# Patient Record
Sex: Female | Born: 1940 | Race: White | Hispanic: No | Marital: Married | Smoking: Never smoker
Health system: Southern US, Community
[De-identification: ages and names within clinical notes are randomized; demographics above are authoritative.]

## PROBLEM LIST (undated history)

## (undated) DIAGNOSIS — M48061 Spinal stenosis, lumbar region without neurogenic claudication: Secondary | ICD-10-CM

## (undated) DIAGNOSIS — I495 Sick sinus syndrome: Secondary | ICD-10-CM

## (undated) DIAGNOSIS — N183 Chronic kidney disease, stage 3 unspecified: Secondary | ICD-10-CM

## (undated) DIAGNOSIS — K449 Diaphragmatic hernia without obstruction or gangrene: Secondary | ICD-10-CM

## (undated) DIAGNOSIS — T783XXA Angioneurotic edema, initial encounter: Secondary | ICD-10-CM

## (undated) DIAGNOSIS — M48062 Spinal stenosis, lumbar region with neurogenic claudication: Secondary | ICD-10-CM

## (undated) DIAGNOSIS — E079 Disorder of thyroid, unspecified: Secondary | ICD-10-CM

## (undated) DIAGNOSIS — I447 Left bundle-branch block, unspecified: Secondary | ICD-10-CM

## (undated) DIAGNOSIS — T8859XA Other complications of anesthesia, initial encounter: Secondary | ICD-10-CM

## (undated) DIAGNOSIS — R609 Edema, unspecified: Secondary | ICD-10-CM

## (undated) DIAGNOSIS — E039 Hypothyroidism, unspecified: Secondary | ICD-10-CM

## (undated) DIAGNOSIS — M1990 Unspecified osteoarthritis, unspecified site: Secondary | ICD-10-CM

## (undated) DIAGNOSIS — E119 Type 2 diabetes mellitus without complications: Secondary | ICD-10-CM

## (undated) DIAGNOSIS — I10 Essential (primary) hypertension: Secondary | ICD-10-CM

## (undated) DIAGNOSIS — D649 Anemia, unspecified: Secondary | ICD-10-CM

## (undated) DIAGNOSIS — T4145XA Adverse effect of unspecified anesthetic, initial encounter: Secondary | ICD-10-CM

## (undated) DIAGNOSIS — Z950 Presence of cardiac pacemaker: Secondary | ICD-10-CM

## (undated) DIAGNOSIS — J45909 Unspecified asthma, uncomplicated: Secondary | ICD-10-CM

## (undated) DIAGNOSIS — E7800 Pure hypercholesterolemia, unspecified: Secondary | ICD-10-CM

## (undated) DIAGNOSIS — F0280 Dementia in other diseases classified elsewhere without behavioral disturbance: Secondary | ICD-10-CM

## (undated) DIAGNOSIS — I499 Cardiac arrhythmia, unspecified: Secondary | ICD-10-CM

## (undated) DIAGNOSIS — K219 Gastro-esophageal reflux disease without esophagitis: Secondary | ICD-10-CM

## (undated) DIAGNOSIS — R001 Bradycardia, unspecified: Secondary | ICD-10-CM

## (undated) HISTORY — PX: TONSILLECTOMY: SUR1361

## (undated) HISTORY — PX: APPENDECTOMY: SHX54

## (undated) HISTORY — PX: CHOLECYSTECTOMY: SHX55

## (undated) HISTORY — PX: HYSTERECTOMY ABDOMINAL WITH SALPINGO-OOPHORECTOMY: SHX6792

---

## 1963-07-02 HISTORY — PX: BREAST BIOPSY: SHX20

## 1968-07-01 HISTORY — PX: THYROID SURGERY: SHX805

## 1968-07-01 HISTORY — PX: THYROIDECTOMY: SHX17

## 1981-07-01 HISTORY — PX: ABDOMINAL HYSTERECTOMY: SHX81

## 2005-01-30 ENCOUNTER — Ambulatory Visit: Payer: Self-pay | Admitting: Internal Medicine

## 2005-05-13 ENCOUNTER — Ambulatory Visit: Payer: Self-pay

## 2005-07-01 HISTORY — PX: UMBILICAL HERNIA REPAIR: SHX196

## 2005-10-01 ENCOUNTER — Ambulatory Visit: Payer: Self-pay | Admitting: General Surgery

## 2005-10-14 ENCOUNTER — Other Ambulatory Visit: Payer: Self-pay

## 2005-10-14 ENCOUNTER — Ambulatory Visit: Payer: Self-pay | Admitting: General Surgery

## 2005-10-21 ENCOUNTER — Ambulatory Visit: Payer: Self-pay | Admitting: General Surgery

## 2006-01-16 ENCOUNTER — Ambulatory Visit: Payer: Self-pay | Admitting: Internal Medicine

## 2006-10-20 ENCOUNTER — Emergency Department: Payer: Self-pay | Admitting: Emergency Medicine

## 2007-01-06 ENCOUNTER — Ambulatory Visit: Payer: Self-pay | Admitting: Internal Medicine

## 2008-02-11 ENCOUNTER — Ambulatory Visit: Payer: Self-pay | Admitting: Internal Medicine

## 2009-01-29 DIAGNOSIS — T783XXA Angioneurotic edema, initial encounter: Secondary | ICD-10-CM

## 2009-01-29 HISTORY — DX: Angioneurotic edema, initial encounter: T78.3XXA

## 2009-09-08 ENCOUNTER — Ambulatory Visit: Payer: Self-pay | Admitting: Internal Medicine

## 2010-09-10 ENCOUNTER — Ambulatory Visit: Payer: Self-pay | Admitting: Internal Medicine

## 2011-09-11 ENCOUNTER — Ambulatory Visit: Payer: Self-pay | Admitting: Internal Medicine

## 2012-01-09 ENCOUNTER — Emergency Department: Payer: Self-pay | Admitting: Emergency Medicine

## 2012-02-11 ENCOUNTER — Ambulatory Visit: Payer: Self-pay | Admitting: Podiatry

## 2012-03-31 HISTORY — PX: FOOT FUSION: SHX956

## 2012-04-01 ENCOUNTER — Ambulatory Visit: Payer: Self-pay | Admitting: Podiatry

## 2012-04-07 ENCOUNTER — Ambulatory Visit: Payer: Self-pay | Admitting: Internal Medicine

## 2012-04-10 ENCOUNTER — Ambulatory Visit: Payer: Self-pay | Admitting: Podiatry

## 2012-04-22 ENCOUNTER — Ambulatory Visit: Payer: Self-pay

## 2012-04-22 LAB — CBC WITH DIFFERENTIAL/PLATELET
Basophil #: 0.1 10*3/uL (ref 0.0–0.1)
Basophil %: 1.3 %
Eosinophil #: 0.2 10*3/uL (ref 0.0–0.7)
HCT: 32.1 % — ABNORMAL LOW (ref 35.0–47.0)
HGB: 10.1 g/dL — ABNORMAL LOW (ref 12.0–16.0)
Lymphocyte #: 2 10*3/uL (ref 1.0–3.6)
Lymphocyte %: 18.8 %
MCH: 22.1 pg — ABNORMAL LOW (ref 26.0–34.0)
MCHC: 31.3 g/dL — ABNORMAL LOW (ref 32.0–36.0)
MCV: 71 fL — ABNORMAL LOW (ref 80–100)
Monocyte #: 0.9 x10 3/mm (ref 0.2–0.9)
Neutrophil #: 7.2 10*3/uL — ABNORMAL HIGH (ref 1.4–6.5)
Platelet: 402 10*3/uL (ref 150–440)
RDW: 17.3 % — ABNORMAL HIGH (ref 11.5–14.5)

## 2012-04-22 LAB — CREATININE, SERUM
EGFR (African American): 57 — ABNORMAL LOW
EGFR (Non-African Amer.): 50 — ABNORMAL LOW

## 2012-06-13 ENCOUNTER — Emergency Department: Payer: Self-pay | Admitting: Emergency Medicine

## 2012-10-02 IMAGING — CR DG CHEST 2V
1 series · 2 of 2 positions shown · non-contrast
Comparison: none

REASON FOR EXAM: htn,diabetes
COMMENTS:

PROCEDURE:     DXR - DXR CHEST PA (OR AP) AND LATERAL  - April 01, 2012  [DATE]
RESULT:     Comparison: None

[Series 1: w chest pa · 0.14mm/px · 2 of 2 slices shown]
[im 1/2]
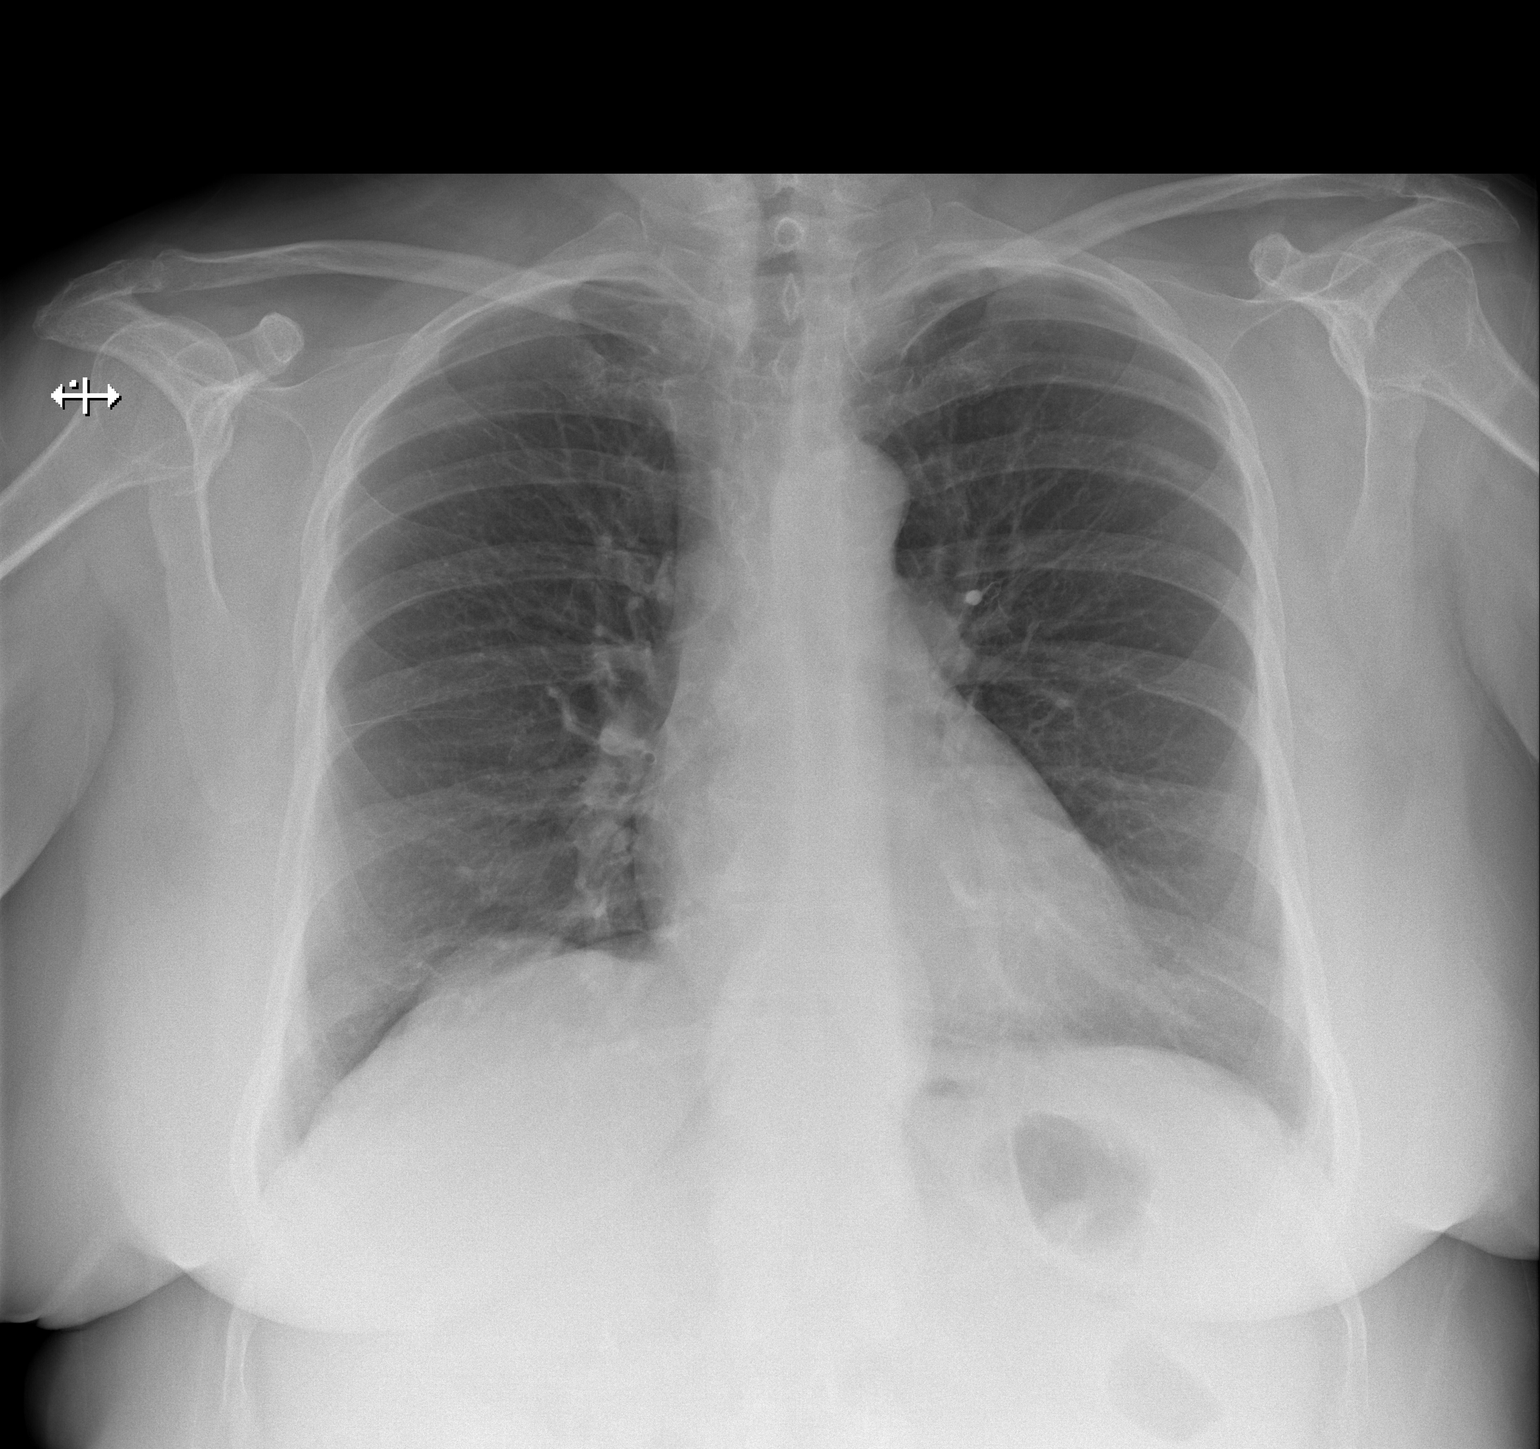
[im 2/2]
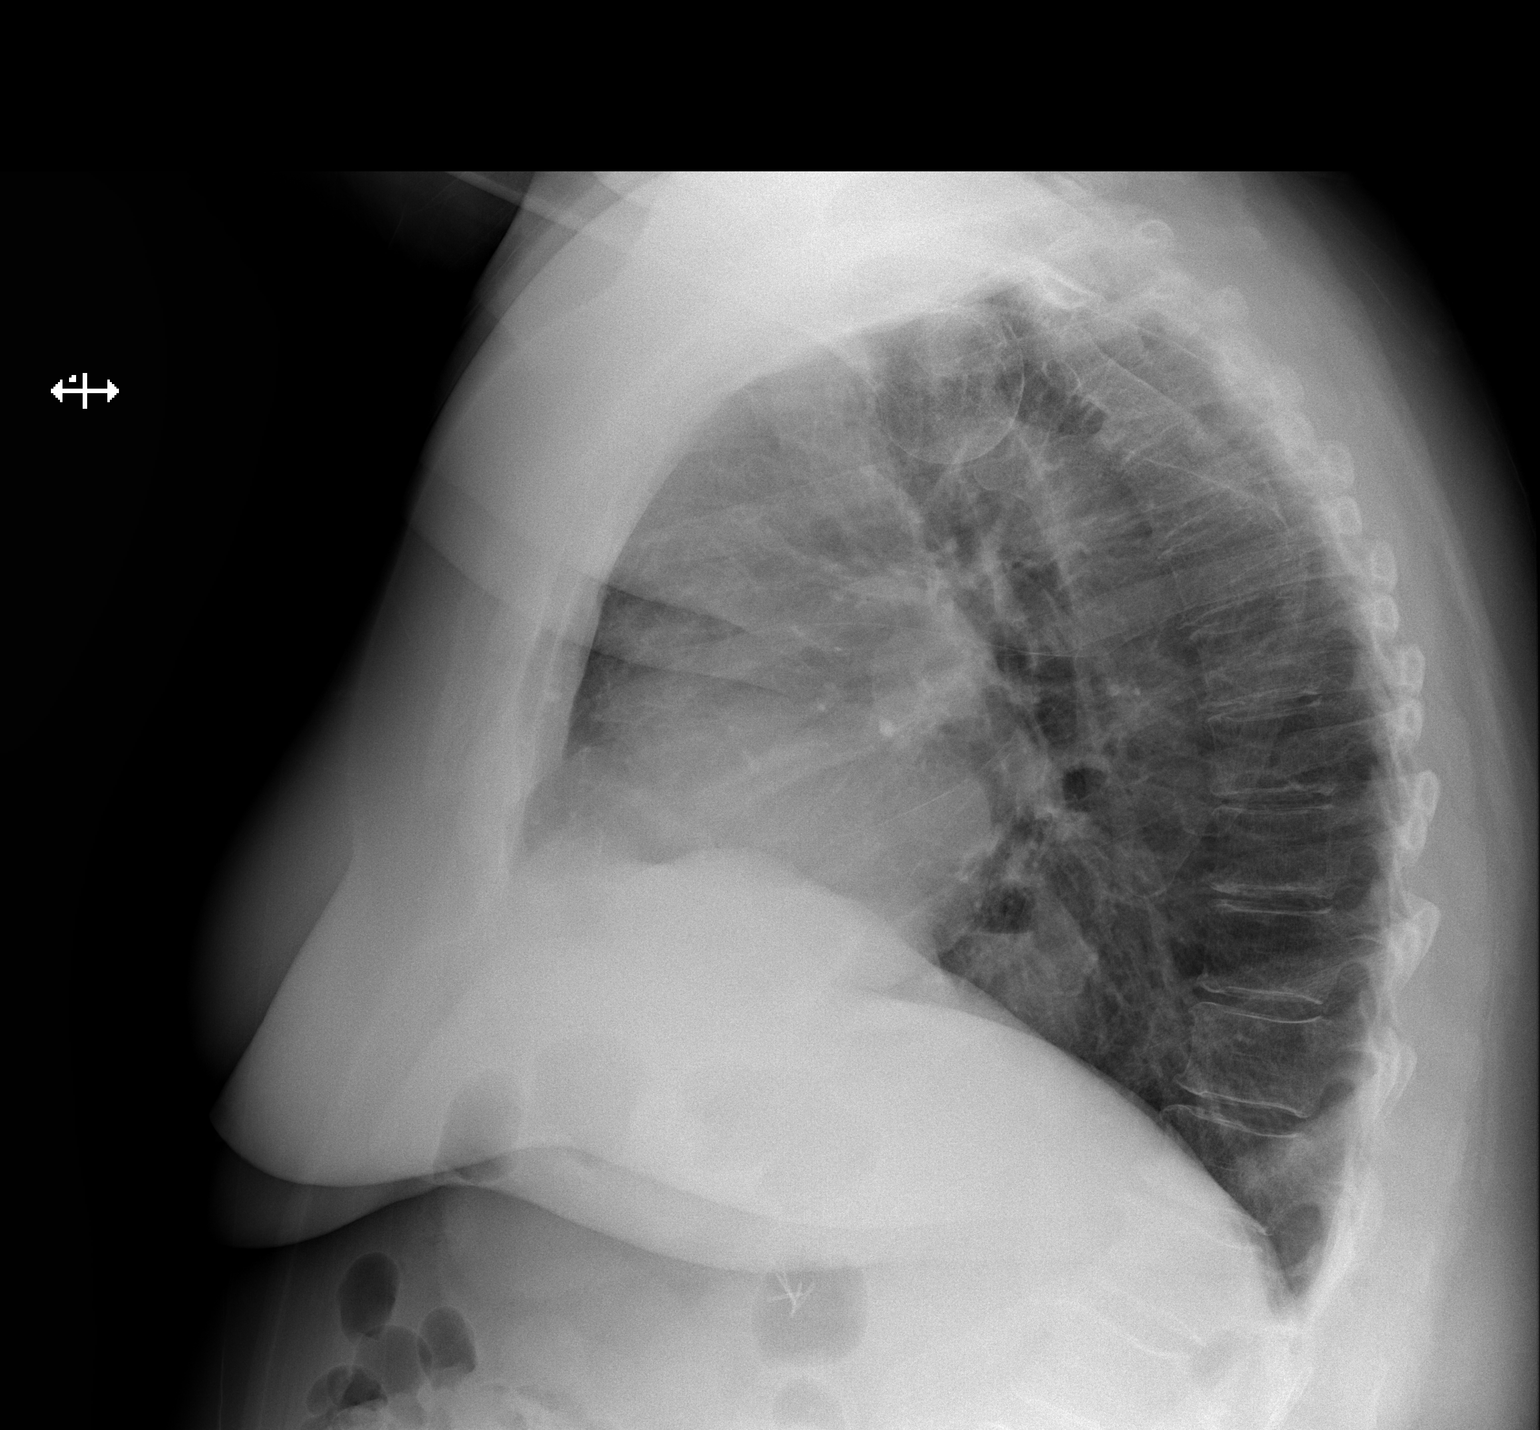

[2 of 2 positions shown; findings below may reference images not displayed]

FINDINGS: PA and lateral chest radiographs are provided.  There is no focal
parenchymal opacity, pleural effusion, or pneumothorax. The heart and
mediastinum are unremarkable.  The osseous structures are unremarkable.
IMPRESSION: No acute disease of the che[REDACTED]

## 2012-11-18 ENCOUNTER — Ambulatory Visit: Payer: Self-pay | Admitting: Internal Medicine

## 2012-12-01 ENCOUNTER — Emergency Department: Payer: Self-pay | Admitting: Emergency Medicine

## 2013-03-15 ENCOUNTER — Ambulatory Visit: Payer: Self-pay | Admitting: Physical Medicine and Rehabilitation

## 2013-11-09 DIAGNOSIS — M5416 Radiculopathy, lumbar region: Secondary | ICD-10-CM | POA: Insufficient documentation

## 2013-11-09 DIAGNOSIS — M48062 Spinal stenosis, lumbar region with neurogenic claudication: Secondary | ICD-10-CM | POA: Insufficient documentation

## 2014-01-07 ENCOUNTER — Ambulatory Visit: Payer: Self-pay | Admitting: Internal Medicine

## 2014-10-18 NOTE — Op Note (Signed)
PATIENT NAME:  Caitlyn Stephenson, Caitlyn Stephenson MR#:  [id number] DATE OF BIRTH:  [birth year]  DATE OF PROCEDURE:  04/10/2012  PREOPERATIVE DIAGNOSES:  1. Degenerative arthritis medial navicular cuneiform joint, right foot.  2. Bone cyst medial navicular right foot.  POSTOPERATIVE DIAGNOSES:  1. Degenerative arthritis medial navicular cuneiform joint, right foot.  2. Bone cyst medial navicular right foot. 3. Torn anterior tibial tendon, right foot.   PROCEDURES:  1. Arthrodesis first medial navicular cuneiform joint with two metric super staples 15 x 12 x 12 and 2 millimeter <<MISSperiodING TEXT>>. 2. Excision of bone cyst from right navicular with packing of DBX putty.  3. Repair of tibialis anterior tendon, right foot.   SURGEON: Epimenio Sarin, DPM  ASSISTANT: None.   HISTORY OF PRESENT ILLNESS: The patient has had pain and discomfort in her right foot for six months or more, treated conservatively but she continues to have problems with pain, discomfort, and irritation causing her trouble with standing and walking, still having some soreness and irritation with it, creating problems with her ability to ambulate.  Conservative treatment has been ineffective and she desires surgical correction.   ANESTHESIA: General.   ANESTHETIST: Linden Dolin, CRNA   ESTIMATED BLOOD LOSS: Negligible.   HEMOSTASIS: Ankle tourniquet 250 mmHg.   OPERATIVE REPORT: The patient was brought to the operating room and placed on the operating room table in the supine position. At this point after general anesthesia was achieved, the patient was prepped and draped in the usual sterile manner.   At this time attention was directed to the dorsum of the right foot where a 6-cm dorsal linear skin incision was made over the medial navicular cuneiform region. This was deepened with sharp and blunt dissection. Bleeders were clamped and bovied as required. The tibialis anterior tendon was identified and retracted medially and an  incision made through the capsular and periosteal tissue down to bone. This was freed medially and laterally. The joint was identified and a spreader was used to open up the joint. There were degenerative changes noted in the region as well as some mild cystic fluid emitting from the area as well. This was all cleaned out and the articular cartilage was cleaned off of both aspects of the joint using curettage. The cyst was then cleaned out with curettage as well until good bone formation was noted in the region. At this time, the area was drilled with multiple 1.5 drill holes. These portions of bone were saved. The area was then irrigated and flushed and those portions of bone were repacked back in the cystic area. DBX [employer] putty was then placed in the area and two of the metric super staples were used to fixate the fusion site at the medial navicular cuneiform joint. These were checked with FluoroScan and good position and correction and fixation were noted. No evidence of the joint was noted following closure. At this point it was noted that the tibialis anterior tendon had a longitudinal tear, particularly dorsal laterally. This was cleaned up, fibrous tissue was removed, and the tendon was <<repaired and tubularizedMISSING TEXT>> with a 3-0 Vicryl continuous stitch. After copious irrigation, the periosteal and capsular tissues were enclosed with 3-0 Vicryl in a continuous stitch. Deep and superficial fascia layers were closed with 4-0 Vicryl in a continuous stitch. The skin was closed with 4-0 Vicryl in a subcuticular fashion.   At this time the area was blocked with 0.5% Marcaine plain. Sterile compressive dressing was placed across  the wounds consisting of Steri-Strips, Xeroform gauze, 4 x 4's, Kling, and Kerlix. The tourniquet had been released after closure of the deep tissues and minimal bleeding was encountered. Epinephrine had been used earlier in the case. A posterior  splint was placed on the right foot and leg in the operating room. The patient had good capillary refill times to all digits of the right foot. The patient tolerated the procedure and anesthesia well and left the operating room for the recovery room with vital signs stable and neurovascular status intact.    ____________________________ Rhona RaiderMatthew G. Holy Battenfield, DPM mgt:bjt D: 04/10/2012 11:50:33 ET T: 04/10/2012 12:39:40 ET JOB#: [id number] Epimenio SarinMATTHEW G Skyley Grandmaison MD ELECTRONICALLY SIGNED 04/16/2012 15:18

## 2015-05-11 DIAGNOSIS — R42 Dizziness and giddiness: Secondary | ICD-10-CM | POA: Insufficient documentation

## 2015-05-11 DIAGNOSIS — I447 Left bundle-branch block, unspecified: Secondary | ICD-10-CM

## 2015-05-11 HISTORY — DX: Left bundle-branch block, unspecified: I44.7

## 2015-07-20 ENCOUNTER — Other Ambulatory Visit: Payer: Self-pay | Admitting: Internal Medicine

## 2015-07-20 DIAGNOSIS — Z1231 Encounter for screening mammogram for malignant neoplasm of breast: Secondary | ICD-10-CM

## 2015-08-02 ENCOUNTER — Ambulatory Visit
Admission: RE | Admit: 2015-08-02 | Discharge: 2015-08-02 | Disposition: A | Discharge status: Home / Self Care | Payer: Medicare Other | Source: Ambulatory Visit | Attending: Internal Medicine | Admitting: Internal Medicine

## 2015-08-02 ENCOUNTER — Other Ambulatory Visit: Payer: Self-pay | Admitting: Internal Medicine

## 2015-08-02 DIAGNOSIS — Z1231 Encounter for screening mammogram for malignant neoplasm of breast: Secondary | ICD-10-CM | POA: Insufficient documentation

## 2016-03-14 ENCOUNTER — Emergency Department: Payer: Medicare Other

## 2016-03-14 ENCOUNTER — Emergency Department
Admission: EM | Admit: 2016-03-14 | Discharge: 2016-03-14 | Disposition: A | Discharge status: Home / Self Care | Payer: Medicare Other | Attending: Emergency Medicine | Admitting: Emergency Medicine

## 2016-03-14 ENCOUNTER — Encounter: Payer: Self-pay | Admitting: Medical Oncology

## 2016-03-14 DIAGNOSIS — E119 Type 2 diabetes mellitus without complications: Secondary | ICD-10-CM | POA: Diagnosis not present

## 2016-03-14 DIAGNOSIS — R079 Chest pain, unspecified: Secondary | ICD-10-CM | POA: Diagnosis not present

## 2016-03-14 DIAGNOSIS — I10 Essential (primary) hypertension: Secondary | ICD-10-CM | POA: Insufficient documentation

## 2016-03-14 HISTORY — DX: Pure hypercholesterolemia, unspecified: E78.00

## 2016-03-14 HISTORY — DX: Edema, unspecified: R60.9

## 2016-03-14 HISTORY — DX: Type 2 diabetes mellitus without complications: E11.9

## 2016-03-14 HISTORY — DX: Disorder of thyroid, unspecified: E07.9

## 2016-03-14 HISTORY — DX: Anemia, unspecified: D64.9

## 2016-03-14 HISTORY — DX: Essential (primary) hypertension: I10

## 2016-03-14 LAB — CBC
HEMATOCRIT: 45 % (ref 35.0–47.0)
HEMOGLOBIN: 15.1 g/dL (ref 12.0–16.0)
MCH: 30 pg (ref 26.0–34.0)
MCHC: 33.4 g/dL (ref 32.0–36.0)
MCV: 89.9 fL (ref 80.0–100.0)
Platelets: 271 10*3/uL (ref 150–440)
RBC: 5.01 MIL/uL (ref 3.80–5.20)
RDW: 13.9 % (ref 11.5–14.5)
WBC: 7.6 10*3/uL (ref 3.6–11.0)

## 2016-03-14 LAB — TROPONIN I: Troponin I: <0.03 ng/mL (ref <0.03)

## 2016-03-14 LAB — BASIC METABOLIC PANEL
ANION GAP: 10 (ref 5–15)
BUN: 15 mg/dL (ref 6–20)
CHLORIDE: 109 mmol/L (ref 101–111)
CO2: 22 mmol/L (ref 22–32)
Calcium: 8.7 mg/dL — ABNORMAL LOW (ref 8.9–10.3)
Creatinine, Ser: 1.05 mg/dL — ABNORMAL HIGH (ref 0.44–1.00)
GFR, EST AFRICAN AMERICAN: 58 mL/min — AB (ref >60)
GFR, EST NON AFRICAN AMERICAN: 50 mL/min — AB (ref >60)
Glucose, Bld: 103 mg/dL — ABNORMAL HIGH (ref 65–99)
POTASSIUM: 4 mmol/L (ref 3.5–5.1)
Sodium: 141 mmol/L (ref 135–145)

## 2016-03-14 MED ORDER — GI COCKTAIL ~~LOC~~: 30.0000 mL | Freq: Two times a day (BID) | ORAL | 0 refills | Status: DC | PRN

## 2016-03-14 NOTE — ED Triage Notes (Signed)
Pt woke up this am around 0130 with pain under her left breast. Pt reports pain is sharp and makes her SOB  At times. Pt states that she was supposed to be here this am for stress test bc she has been having these pains off and on and d/t her low HR.

## 2016-03-14 NOTE — ED Notes (Signed)
Pt woke up this am around 0130 with pain under her left breast. Pt reports pain is sharp and makes her SOB  At times. Pt states that she was supposed to be here this am for stress test bc she has been having these pains off and on and d/t her low HR. Pt denies pain at this time.

## 2016-03-14 NOTE — Discharge Instructions (Signed)
You have been seen in the emergency department today for chest pain. Your workup has shown normal results. As we discussed please follow-up with your primary care physician in the next 1-2 days for recheck. Return to the emergency department for any further chest pain, trouble breathing, or any other symptom personally concerning to yourself. °

## 2016-03-14 NOTE — ED Provider Notes (Signed)
[facility] Emergency Department Provider Note  Time seen: 4:41 PM  I have reviewed the triage vital signs and the nursing notes.   HISTORY  Chief Complaint Chest Pain    HPI Caitlyn Stephenson is a 75 y.o. female with a past medical history of anemia, diabetes, hypertension, hyperlipidemia presents to the emergency department for chest pain. According to the patient for the past several weeks she has been experiencing intermittent left-sided chest pain. States that it occurs it is a sharp sensation, moderate severity that lasts between 2-10 minutes. Denies any shortness of breath nausea or diaphoresis during any the events. Last night the patient states she had one around 1:30 in the morning, but it went away after several minutes. She had a second one around 11:00 so she came to the emergency department for evaluation. Patient states she has seen a cardiologist for these symptoms, actually had a stress test scheduled today which she missed so she came to the ER. Denies any chest pain since arriving to the emergency department.  Past Medical History:  Diagnosis Date  . Anemia   . Diabetes mellitus without complication (HCC)   . Edema   . High cholesterol   . Hypertension   . Thyroid disease     There are no active problems to display for this patient.   Past Surgical History:  Procedure Laterality Date  . BREAST BIOPSY Right 1965   cyst removed    Prior to Admission medications   Not on File    Allergies  Allergen Reactions  . Garlic   . Shellfish Allergy     No family history on file.  Social History Social History  Substance Use Topics  . Smoking status: Not on file  . Smokeless tobacco: Not on file  . Alcohol use Not on file    Review of Systems Constitutional: Negative for fever Cardiovascular: Positive chest pain. Respiratory: Negative for shortness of breath. Gastrointestinal: Negative for abdominal pain Musculoskeletal: Negative  for back pain. Neurological: Negative for headache 10-point ROS otherwise negative.  ____________________________________________   PHYSICAL EXAM:  VITAL SIGNS: ED Triage Vitals  Enc Vitals Group     BP 03/14/16 1140 (!) 162/84     Pulse Rate 03/14/16 1140 72     Resp 03/14/16 1140 18     Temp 03/14/16 1140 98.5 F (36.9 C)     Temp Source 03/14/16 1137 Oral     SpO2 03/14/16 1140 96 %     Weight 03/14/16 1137 154 lb (69.9 kg)     Height 03/14/16 1137 5\' 3"  (1.6 m)     Head Circumference --      Peak Flow --      Pain Score 03/14/16 1138 6     Pain Loc --      Pain Edu? --      Excl. in GC? --     Constitutional: Alert and oriented. Well appearing and in no distress. Eyes: Normal exam ENT   Head: Normocephalic and atraumatic   Mouth/Throat: Mucous membranes are moist. Cardiovascular: Normal rate, regular rhythm. No murmur Respiratory: Normal respiratory effort without tachypnea nor retractions. Breath sounds are clear  Gastrointestinal: Soft and nontender. No distention.   Musculoskeletal: Nontender with normal range of motion in all extremities. No lower extremity tenderness or edema. Neurologic:  Normal speech and language. No gross focal neurologic deficits Skin:  Skin is warm, dry and intact.  Psychiatric: Mood and affect are normal.   ____________________________________________  EKG  EKG reviewed and interpreted by myself shows a sinus rhythm around 66 bpm, widened QRS complex occasional PVCs. Nonspecific ST changes without obvious ST elevation.  ____________________________________________    RADIOLOGY  Chest x-ray shows a Hiatal hernia  ____________________________________________   INITIAL IMPRESSION / ASSESSMENT AND PLAN / ED COURSE  Pertinent labs & imaging results that were available during my care of the patient were reviewed by me and considered in my medical decision making (see chart for details).  Patient presents emergency  department with intermittent left-sided chest pain over the past few weeks. Patient had 2 episodes today so she came to the emergency department for evaluation. Patient denies any complaints at this time. Patient does have PVCs, but denies any palpitations or chest pain. Patient's workup including troponin is negative. Patient has close follow-up with cardiology. We will repeat a troponin in the emergency department. If the second troponin is negative the patient will be discharged home with cardiology follow-up. The patient is agreeable to this plan. Remains chest pain-free in the [facility] department. [facility] does have moderate left-sided chest tenderness to palpation, and a chest x-ray which is consistent with a hiatal hernia.  Second troponin is negative. Patient remained symptom-free in the emergency department. I discussed discharge home with a trial of GI cocktail if symptoms recur. The patient has bad chest pain she is to return immediately to the emergency department. Patient will follow up with cardiology tomorrow.  ____________________________________________   FINAL CLINICAL IMPRESSION(S) / ED DIAGNOSES  Chest pain    Minna AntisKevin Laylanie Kruczek, MD 03/14/16 [phone]

## 2016-05-09 DIAGNOSIS — N183 Chronic kidney disease, stage 3 unspecified: Secondary | ICD-10-CM | POA: Insufficient documentation

## 2016-05-09 DIAGNOSIS — E118 Type 2 diabetes mellitus with unspecified complications: Secondary | ICD-10-CM | POA: Insufficient documentation

## 2016-05-29 DIAGNOSIS — M1711 Unilateral primary osteoarthritis, right knee: Principal | ICD-10-CM | POA: Insufficient documentation

## 2016-06-20 ENCOUNTER — Other Ambulatory Visit: Payer: Self-pay | Admitting: Internal Medicine

## 2016-06-20 DIAGNOSIS — Z1231 Encounter for screening mammogram for malignant neoplasm of breast: Secondary | ICD-10-CM

## 2016-07-08 ENCOUNTER — Other Ambulatory Visit: Payer: Self-pay | Admitting: Orthopedic Surgery

## 2016-07-08 DIAGNOSIS — M1711 Unilateral primary osteoarthritis, right knee: Secondary | ICD-10-CM

## 2016-07-08 DIAGNOSIS — M2351 Chronic instability of knee, right knee: Secondary | ICD-10-CM

## 2016-07-08 DIAGNOSIS — M2391 Unspecified internal derangement of right knee: Secondary | ICD-10-CM

## 2016-07-15 ENCOUNTER — Ambulatory Visit
Admission: RE | Admit: 2016-07-15 | Discharge: 2016-07-15 | Disposition: A | Discharge status: Home / Self Care | Payer: Medicare Other | Source: Ambulatory Visit | Attending: Orthopedic Surgery | Admitting: Orthopedic Surgery

## 2016-07-15 DIAGNOSIS — M2391 Unspecified internal derangement of right knee: Secondary | ICD-10-CM | POA: Diagnosis present

## 2016-07-15 DIAGNOSIS — M25461 Effusion, right knee: Secondary | ICD-10-CM | POA: Insufficient documentation

## 2016-07-15 DIAGNOSIS — M1711 Unilateral primary osteoarthritis, right knee: Secondary | ICD-10-CM | POA: Diagnosis not present

## 2016-07-15 DIAGNOSIS — M2351 Chronic instability of knee, right knee: Secondary | ICD-10-CM | POA: Diagnosis present

## 2016-08-30 ENCOUNTER — Ambulatory Visit: Payer: Medicare Other

## 2017-07-01 DIAGNOSIS — I499 Cardiac arrhythmia, unspecified: Secondary | ICD-10-CM

## 2017-07-01 HISTORY — DX: Cardiac arrhythmia, unspecified: I49.9

## 2017-07-11 ENCOUNTER — Ambulatory Visit
Admission: RE | Admit: 2017-07-11 | Discharge: 2017-07-11 | Disposition: A | Discharge status: Home / Self Care | Payer: Medicare Other | Source: Ambulatory Visit | Attending: Cardiology | Admitting: Cardiology

## 2017-07-11 ENCOUNTER — Encounter
Admission: RE | Admit: 2017-07-11 | Discharge: 2017-07-11 | Disposition: A | Discharge status: Home / Self Care | Payer: Medicare Other | Source: Ambulatory Visit | Attending: Cardiology | Admitting: Cardiology

## 2017-07-11 ENCOUNTER — Other Ambulatory Visit: Payer: Self-pay

## 2017-07-11 DIAGNOSIS — Z01818 Encounter for other preprocedural examination: Secondary | ICD-10-CM | POA: Diagnosis present

## 2017-07-11 DIAGNOSIS — I700 Atherosclerosis of aorta: Secondary | ICD-10-CM | POA: Insufficient documentation

## 2017-07-11 DIAGNOSIS — Z01812 Encounter for preprocedural laboratory examination: Secondary | ICD-10-CM | POA: Insufficient documentation

## 2017-07-11 HISTORY — DX: Adverse effect of unspecified anesthetic, initial encounter: T41.45XA

## 2017-07-11 HISTORY — DX: Gastro-esophageal reflux disease without esophagitis: K21.9

## 2017-07-11 HISTORY — DX: Other complications of anesthesia, initial encounter: T88.59XA

## 2017-07-11 HISTORY — DX: Hypothyroidism, unspecified: E03.9

## 2017-07-11 HISTORY — DX: Cardiac arrhythmia, unspecified: I49.9

## 2017-07-11 LAB — PROTIME-INR
INR: 0.96
Prothrombin Time: 12.7 seconds (ref 11.4–15.2)

## 2017-07-11 LAB — CBC
HCT: 43.5 % (ref 35.0–47.0)
Hemoglobin: 14.1 g/dL (ref 12.0–16.0)
MCH: 29.7 pg (ref 26.0–34.0)
MCHC: 32.5 g/dL (ref 32.0–36.0)
MCV: 91.3 fL (ref 80.0–100.0)
PLATELETS: 241 10*3/uL (ref 150–440)
RBC: 4.76 MIL/uL (ref 3.80–5.20)
RDW: 13.2 % (ref 11.5–14.5)
WBC: 7.6 10*3/uL (ref 3.6–11.0)

## 2017-07-11 LAB — BASIC METABOLIC PANEL
Anion gap: 9 (ref 5–15)
BUN: 21 mg/dL — AB (ref 6–20)
CALCIUM: 9.2 mg/dL (ref 8.9–10.3)
CO2: 25 mmol/L (ref 22–32)
CREATININE: 1.02 mg/dL — AB (ref 0.44–1.00)
Chloride: 109 mmol/L (ref 101–111)
GFR calc Af Amer: 60 mL/min — ABNORMAL LOW (ref >60)
GFR, EST NON AFRICAN AMERICAN: 52 mL/min — AB (ref >60)
GLUCOSE: 79 mg/dL (ref 65–99)
POTASSIUM: 4.6 mmol/L (ref 3.5–5.1)
Sodium: 143 mmol/L (ref 135–145)

## 2017-07-11 LAB — SURGICAL PCR SCREEN
MRSA, PCR: NEGATIVE
Staphylococcus aureus: NEGATIVE

## 2017-07-11 LAB — APTT: aPTT: 26 seconds (ref 24–36)

## 2017-07-11 NOTE — Patient Instructions (Signed)
Your procedure is scheduled on: Monday, July 14, 2017  Report to [facility], 2ND FLOOR  PLEASE ARRIVE AT 10:45 AM ON MORNING OF SURGERY.  Remember: Instructions that are not followed completely may result in serious medical risk, up to and including death, or upon the discretion of your surgeon and anesthesiologist your surgery may need to be rescheduled.     _X__ 1. Do not eat food after midnight the night before your procedure.                 No gum chewing or hard candies. You may drink clear liquids up to 2 hours                 before you are scheduled to arrive for your surgery- DO not drink clear                 liquids within 2 hours of the start of your surgery.                 Clear Liquids include:  water, apple juice without pulp, clear carbohydrate                 drink such as Clearfast of Gartorade, Black Coffee or Tea (Do not add                 anything to coffee or tea). You may drink up until 9am on Monday.     _X__ 2.  No Alcohol for 24 hours before or after surgery.   _X__ 3.  Do Not Smoke or use e-cigarettes For 24 Hours Prior to Your Surgery.                 Do not use any chewable tobacco products for at least 6 hours prior to                 surgery.  ____  4.  Bring all medications with you on the day of surgery if instructed.   ____  5.  Notify your doctor if there is any change in your medical condition      (cold, fever, infections).     Do not wear jewelry, make-up, hairpins, clips or nail polish. Do not wear lotions, powders, or perfumes. You may wear deodorant. Do not shave 48 hours prior to surgery. Men may shave face and neck. Do not bring valuables to the hospital.    [facility] is not responsible for any belongings or valuables.  Contacts, dentures or bridgework may not be worn into surgery. Leave your suitcase in the car. After surgery it may be brought to your room. For patients admitted to the hospital, discharge  time is determined by your treatment team.   Patients discharged the day of surgery will not be allowed to drive home.   Please read over the following fact sheets that you were given:   PREPARING FOR SURGERY                 MRSA: STOP THE SPREAD   ____ Take these medicines the morning of surgery with A SIP OF WATER:    1. PREVACID  2. SYNTHROID  3.  TRAMADOL, IF NEEDED  4.  5.  6.  ____ Fleet Enema (as directed)   ___X_ Use CHG Soap as directed  ____ Use inhalers on the day of surgery  __X__ DO NOT TAKE GLIMIPERIDE ON THE DAY OF SURGERY   _X___ Stop ASPIRIN  AS OF TODAY  ____ Stop Anti-inflammatories AS OF TODAY  _X___ Stop supplements until after surgery.  THIS INCLUDES IRON, BIOTIN, KERALIN  ____ Bring C-Pap to the hospital.   YOU MAY CONTINUE YOUR OTHER MEDICATIONS AS PRESCRIBED.  DO NOT TAKE LISINOPRIL OR LASIX ON THE DAY OF SURGERY

## 2017-07-14 ENCOUNTER — Other Ambulatory Visit: Payer: Self-pay

## 2017-07-14 ENCOUNTER — Observation Stay: Payer: Medicare Other

## 2017-07-14 ENCOUNTER — Ambulatory Visit: Payer: Medicare Other

## 2017-07-14 ENCOUNTER — Encounter
Admission: RE | Disposition: A | Discharge status: Home / Self Care | Payer: Self-pay | Source: Ambulatory Visit | Attending: Cardiology

## 2017-07-14 ENCOUNTER — Ambulatory Visit: Payer: Medicare Other | Admitting: Certified Registered"

## 2017-07-14 ENCOUNTER — Encounter: Payer: Self-pay | Admitting: *Deleted

## 2017-07-14 ENCOUNTER — Observation Stay
Admission: RE | Admit: 2017-07-14 | Discharge: 2017-07-15 | Disposition: A | Discharge status: Home / Self Care | Payer: Medicare Other | Source: Ambulatory Visit | Attending: Cardiology | Admitting: Cardiology

## 2017-07-14 DIAGNOSIS — E7849 Other hyperlipidemia: Secondary | ICD-10-CM | POA: Insufficient documentation

## 2017-07-14 DIAGNOSIS — I441 Atrioventricular block, second degree: Secondary | ICD-10-CM | POA: Insufficient documentation

## 2017-07-14 DIAGNOSIS — I129 Hypertensive chronic kidney disease with stage 1 through stage 4 chronic kidney disease, or unspecified chronic kidney disease: Secondary | ICD-10-CM | POA: Diagnosis not present

## 2017-07-14 DIAGNOSIS — N183 Chronic kidney disease, stage 3 (moderate): Secondary | ICD-10-CM | POA: Diagnosis not present

## 2017-07-14 DIAGNOSIS — E1122 Type 2 diabetes mellitus with diabetic chronic kidney disease: Secondary | ICD-10-CM | POA: Insufficient documentation

## 2017-07-14 DIAGNOSIS — Z7984 Long term (current) use of oral hypoglycemic drugs: Secondary | ICD-10-CM | POA: Insufficient documentation

## 2017-07-14 DIAGNOSIS — I447 Left bundle-branch block, unspecified: Secondary | ICD-10-CM | POA: Insufficient documentation

## 2017-07-14 DIAGNOSIS — R001 Bradycardia, unspecified: Secondary | ICD-10-CM

## 2017-07-14 DIAGNOSIS — I495 Sick sinus syndrome: Secondary | ICD-10-CM | POA: Diagnosis not present

## 2017-07-14 DIAGNOSIS — K219 Gastro-esophageal reflux disease without esophagitis: Secondary | ICD-10-CM | POA: Diagnosis not present

## 2017-07-14 DIAGNOSIS — E890 Postprocedural hypothyroidism: Secondary | ICD-10-CM | POA: Insufficient documentation

## 2017-07-14 DIAGNOSIS — Z950 Presence of cardiac pacemaker: Secondary | ICD-10-CM

## 2017-07-14 DIAGNOSIS — D509 Iron deficiency anemia, unspecified: Secondary | ICD-10-CM | POA: Insufficient documentation

## 2017-07-14 DIAGNOSIS — R079 Chest pain, unspecified: Secondary | ICD-10-CM | POA: Diagnosis present

## 2017-07-14 DIAGNOSIS — Z7982 Long term (current) use of aspirin: Secondary | ICD-10-CM | POA: Insufficient documentation

## 2017-07-14 DIAGNOSIS — Z833 Family history of diabetes mellitus: Secondary | ICD-10-CM | POA: Insufficient documentation

## 2017-07-14 DIAGNOSIS — Z8249 Family history of ischemic heart disease and other diseases of the circulatory system: Secondary | ICD-10-CM | POA: Diagnosis not present

## 2017-07-14 DIAGNOSIS — Z79899 Other long term (current) drug therapy: Secondary | ICD-10-CM | POA: Insufficient documentation

## 2017-07-14 HISTORY — PX: PACEMAKER INSERTION: SHX728

## 2017-07-14 HISTORY — DX: Sick sinus syndrome: I49.5

## 2017-07-14 HISTORY — DX: Bradycardia, unspecified: R00.1

## 2017-07-14 HISTORY — DX: Presence of cardiac pacemaker: Z95.0

## 2017-07-14 LAB — GLUCOSE, CAPILLARY
Glucose-Capillary: 109 mg/dL — ABNORMAL HIGH (ref 65–99)
Glucose-Capillary: 107 mg/dL — ABNORMAL HIGH (ref 65–99)
Glucose-Capillary: 128 mg/dL — ABNORMAL HIGH (ref 65–99)
Glucose-Capillary: 174 mg/dL — ABNORMAL HIGH (ref 65–99)

## 2017-07-14 SURGERY — INSERTION, CARDIAC PACEMAKER
Anesthesia: General | Site: Chest | Laterality: Left | Wound class: Clean

## 2017-07-14 MED ORDER — SODIUM CHLORIDE 0.9 % IV SOLN
INTRAVENOUS | Status: DC
  Administered 2017-07-14 (×2): via INTRAVENOUS

## 2017-07-14 MED ORDER — ONDANSETRON HCL 4 MG/2ML IJ SOLN: 4.0000 mg | Freq: Once | INTRAMUSCULAR | Status: DC | PRN

## 2017-07-14 MED ORDER — DICLOFENAC SODIUM 1 % TD GEL
4.0000 g | Freq: Four times a day (QID) | TRANSDERMAL | Status: DC
  Filled 2017-07-14: qty 100

## 2017-07-14 MED ORDER — SODIUM CHLORIDE 0.9 % IR SOLN
Freq: Once | Status: DC
  Filled 2017-07-14: qty 2

## 2017-07-14 MED ORDER — OXYCODONE HCL 5 MG PO TABS: 5.0000 mg | ORAL_TABLET | Freq: Once | ORAL | Status: DC | PRN

## 2017-07-14 MED ORDER — GENTAMICIN SULFATE 40 MG/ML IJ SOLN
INTRAMUSCULAR | Status: AC
  Filled 2017-07-14: qty 2

## 2017-07-14 MED ORDER — SODIUM CHLORIDE 0.9 % IJ SOLN
INTRAMUSCULAR | Status: AC
  Filled 2017-07-14: qty 50

## 2017-07-14 MED ORDER — OXYCODONE HCL 5 MG/5ML PO SOLN: 5.0000 mg | Freq: Once | ORAL | Status: DC | PRN

## 2017-07-14 MED ORDER — FERROUS SULFATE 325 (65 FE) MG PO TABS
325.0000 mg | ORAL_TABLET | Freq: Every day | ORAL | Status: DC
  Administered 2017-07-15: 325 mg via ORAL
  Filled 2017-07-14: qty 1

## 2017-07-14 MED ORDER — PROPOFOL 10 MG/ML IV BOLUS
INTRAVENOUS | Status: AC
  Filled 2017-07-14: qty 20

## 2017-07-14 MED ORDER — FENTANYL CITRATE (PF) 100 MCG/2ML IJ SOLN
INTRAMUSCULAR | Status: DC | PRN
  Administered 2017-07-14 (×2): 25 ug via INTRAVENOUS

## 2017-07-14 MED ORDER — CEFAZOLIN SODIUM 1 G IJ SOLR
Freq: Four times a day (QID) | INTRAMUSCULAR | Status: AC
  Administered 2017-07-14 – 2017-07-15 (×3): via INTRAVENOUS
  Filled 2017-07-14 (×3): qty 10

## 2017-07-14 MED ORDER — LIDOCAINE HCL (PF) 2 % IJ SOLN
INTRAMUSCULAR | Status: AC
  Filled 2017-07-14: qty 10

## 2017-07-14 MED ORDER — CEFAZOLIN SODIUM-DEXTROSE 1-4 GM/50ML-% IV SOLN
INTRAVENOUS | Status: AC
  Filled 2017-07-14: qty 50

## 2017-07-14 MED ORDER — CEFAZOLIN SODIUM-DEXTROSE 1-4 GM/50ML-% IV SOLN: 1.0000 g | Freq: Four times a day (QID) | INTRAVENOUS | Status: DC

## 2017-07-14 MED ORDER — TRAMADOL HCL 50 MG PO TABS
50.0000 mg | ORAL_TABLET | Freq: Four times a day (QID) | ORAL | Status: DC | PRN
  Administered 2017-07-14: 50 mg via ORAL
  Filled 2017-07-14: qty 1

## 2017-07-14 MED ORDER — ACETAMINOPHEN 325 MG PO TABS
325.0000 mg | ORAL_TABLET | ORAL | Status: DC | PRN
  Administered 2017-07-14 (×2): 650 mg via ORAL
  Filled 2017-07-14 (×2): qty 2

## 2017-07-14 MED ORDER — FUROSEMIDE 20 MG PO TABS
80.0000 mg | ORAL_TABLET | Freq: Every day | ORAL | Status: DC
  Administered 2017-07-14 – 2017-07-15 (×2): 80 mg via ORAL
  Filled 2017-07-14 (×2): qty 4

## 2017-07-14 MED ORDER — CEFAZOLIN SODIUM-DEXTROSE 1-4 GM/50ML-% IV SOLN
1.0000 g | Freq: Once | INTRAVENOUS | Status: AC
  Administered 2017-07-14: 1 g via INTRAVENOUS

## 2017-07-14 MED ORDER — GLIMEPIRIDE 1 MG PO TABS
1.0000 mg | ORAL_TABLET | Freq: Every day | ORAL | Status: DC
  Administered 2017-07-15: 1 mg via ORAL
  Filled 2017-07-14: qty 1

## 2017-07-14 MED ORDER — EPHEDRINE SULFATE 50 MG/ML IJ SOLN
INTRAMUSCULAR | Status: DC | PRN
  Administered 2017-07-14: 5 mg via INTRAVENOUS
  Administered 2017-07-14: 10 mg via INTRAVENOUS
  Administered 2017-07-14: 5 mg via INTRAVENOUS

## 2017-07-14 MED ORDER — PROPOFOL 500 MG/50ML IV EMUL
INTRAVENOUS | Status: DC | PRN
  Administered 2017-07-14: 55 ug/kg/min via INTRAVENOUS

## 2017-07-14 MED ORDER — LIDOCAINE HCL (CARDIAC) 20 MG/ML IV SOLN
INTRAVENOUS | Status: DC | PRN
  Administered 2017-07-14: 50 mg via INTRAVENOUS

## 2017-07-14 MED ORDER — FENTANYL CITRATE (PF) 100 MCG/2ML IJ SOLN: 25.0000 ug | INTRAMUSCULAR | Status: DC | PRN

## 2017-07-14 MED ORDER — ONDANSETRON HCL 4 MG/2ML IJ SOLN: 4.0000 mg | Freq: Four times a day (QID) | INTRAMUSCULAR | Status: DC | PRN

## 2017-07-14 MED ORDER — PROPOFOL 10 MG/ML IV BOLUS
INTRAVENOUS | Status: DC | PRN
  Administered 2017-07-14: 20 mg via INTRAVENOUS
  Administered 2017-07-14: 10 mg via INTRAVENOUS
  Administered 2017-07-14: 20 mg via INTRAVENOUS

## 2017-07-14 MED ORDER — LEVOTHYROXINE SODIUM 100 MCG PO TABS
100.0000 ug | ORAL_TABLET | Freq: Every day | ORAL | Status: DC
  Administered 2017-07-15: 100 ug via ORAL
  Filled 2017-07-14: qty 1

## 2017-07-14 MED ORDER — LIDOCAINE 1 % OPTIME INJ - NO CHARGE
INTRAMUSCULAR | Status: DC | PRN
  Administered 2017-07-14: 30 mL

## 2017-07-14 MED ORDER — CEFAZOLIN (ANCEF) 1 G IV SOLR: 1.0000 g | INTRAVENOUS | Status: DC

## 2017-07-14 MED ORDER — FENTANYL CITRATE (PF) 100 MCG/2ML IJ SOLN
INTRAMUSCULAR | Status: AC
  Filled 2017-07-14: qty 2

## 2017-07-14 MED ORDER — LISINOPRIL 20 MG PO TABS
20.0000 mg | ORAL_TABLET | Freq: Every day | ORAL | Status: DC
  Administered 2017-07-14 – 2017-07-15 (×2): 20 mg via ORAL
  Filled 2017-07-14 (×2): qty 1

## 2017-07-14 SURGICAL SUPPLY — 37 items
BAG DECANTER FOR FLEXI CONT (MISCELLANEOUS) ×2 IMPLANT
BRUSH SCRUB EZ  4% CHG (MISCELLANEOUS) ×1
BRUSH SCRUB EZ 4% CHG (MISCELLANEOUS) ×1 IMPLANT
CABLE SURG 12 DISP A/V CHANNEL (MISCELLANEOUS) ×4 IMPLANT
CANISTER SUCT 1200ML W/VALVE (MISCELLANEOUS) ×2 IMPLANT
CHLORAPREP W/TINT 26ML (MISCELLANEOUS) ×2 IMPLANT
COVER LIGHT HANDLE STERIS (MISCELLANEOUS) ×4 IMPLANT
COVER MAYO STAND STRL (DRAPES) ×2 IMPLANT
DRAPE C-ARM XRAY 36X54 (DRAPES) ×2 IMPLANT
DRSG TEGADERM 4X4.75 (GAUZE/BANDAGES/DRESSINGS) ×2 IMPLANT
DRSG TELFA 4X3 1S NADH ST (GAUZE/BANDAGES/DRESSINGS) ×2 IMPLANT
ELECT REM PT RETURN 9FT ADLT (ELECTROSURGICAL) ×2
ELECTRODE REM PT RTRN 9FT ADLT (ELECTROSURGICAL) ×1 IMPLANT
GLOVE BIO SURGEON STRL SZ7.5 (GLOVE) ×2 IMPLANT
GLOVE BIO SURGEON STRL SZ8 (GLOVE) ×2 IMPLANT
GOWN STRL REUS W/ TWL LRG LVL3 (GOWN DISPOSABLE) ×1 IMPLANT
GOWN STRL REUS W/ TWL XL LVL3 (GOWN DISPOSABLE) ×1 IMPLANT
GOWN STRL REUS W/TWL LRG LVL3 (GOWN DISPOSABLE) ×1
GOWN STRL REUS W/TWL XL LVL3 (GOWN DISPOSABLE) ×1
IMMOBILIZER SHDR MD LX WHT (SOFTGOODS) IMPLANT
IMMOBILIZER SHDR XL LX WHT (SOFTGOODS) IMPLANT
INTRO PACEMAKR LEAD 9FR 13CM (INTRODUCER) ×2
INTRO PACEMKR SHEATH II 7FR (MISCELLANEOUS) ×4
INTRODUCER PACEMKR LD 9FR 13CM (INTRODUCER) ×1 IMPLANT
INTRODUCER PACEMKR SHTH II 7FR (MISCELLANEOUS) ×2 IMPLANT
IPG PACE AZUR XT DR MRI W1DR01 (Pacemaker) ×2 IMPLANT
IV NS 500ML (IV SOLUTION) ×1
IV NS 500ML BAXH (IV SOLUTION) ×1 IMPLANT
KIT RM TURNOVER STRD PROC AR (KITS) ×2 IMPLANT
LABEL OR SOLS (LABEL) ×2 IMPLANT
LEAD CAPSURE NOVUS 5076-52CM (Lead) ×2 IMPLANT
MARKER SKIN DUAL TIP RULER LAB (MISCELLANEOUS) ×2 IMPLANT
PACE AZURE XT DR MRI W1DR01 (Pacemaker) ×4 IMPLANT
PACEMAKER LEAD ATRL (Lead) ×2 IMPLANT
PACK PACE INSERTION (MISCELLANEOUS) ×2 IMPLANT
PAD ONESTEP ZOLL R SERIES ADT (MISCELLANEOUS) ×2 IMPLANT
SUT SILK 0 SH 30 (SUTURE) ×6 IMPLANT

## 2017-07-14 NOTE — Anesthesia Procedure Notes (Signed)
Performed by: Keddrick Wyne, CRNA Pre-anesthesia Checklist: Patient identified, Emergency Drugs available, Suction available, Patient being monitored and Timeout performed Patient Re-evaluated:Patient Re-evaluated prior to induction Oxygen Delivery Method: Simple face mask       

## 2017-07-14 NOTE — Anesthesia Preprocedure Evaluation (Signed)
Anesthesia Evaluation  Patient identified by MRN, date of birth, ID band Patient awake    Reviewed: Allergy & Precautions, NPO status , Patient's Chart, lab work & pertinent test results  History of Anesthesia Complications (+) history of anesthetic complications (slow to wake up)  Airway Mallampati: II  TM Distance: >3 FB Neck ROM: Full    Dental  (+) Upper Dentures, Lower Dentures   Pulmonary neg pulmonary ROS, neg sleep apnea, neg COPD,    breath sounds clear to auscultation- rhonchi (-) wheezing      Cardiovascular hypertension, Pt. on medications (-) CAD, (-) Past MI, (-) Cardiac Stents and (-) CABG + dysrhythmias (symptomatic bradycardia)  Rhythm:Regular Rate:Normal - Systolic murmurs and - Diastolic murmurs    Neuro/Psych negative neurological ROS  negative psych ROS   GI/Hepatic Neg liver ROS, GERD  ,  Endo/Other  diabetes, Oral Hypoglycemic AgentsHypothyroidism   Renal/GU negative Renal ROS     Musculoskeletal negative musculoskeletal ROS (+)   Abdominal (+) - obese,   Peds  Hematology  (+) anemia ,   Anesthesia Other Findings Past Medical History: No date: Anemia No date: Complication of anesthesia     Comment:  difficult to wake up No date: Diabetes mellitus without complication (HCC) 07/2017: Dysrhythmia     Comment:  SSS,bradycardia No date: Edema No date: GERD (gastroesophageal reflux disease) No date: High cholesterol No date: Hypertension No date: Hypothyroidism No date: Thyroid disease   Reproductive/Obstetrics                             Anesthesia Physical Anesthesia Plan  ASA: III  Anesthesia Plan: General   Post-op Pain Management:    Induction: Intravenous  PONV Risk Score and Plan: 2 and Propofol infusion  Airway Management Planned: Natural Airway  Additional Equipment:   Intra-op Plan:   Post-operative Plan:   Informed Consent: I have  reviewed the patients History and Physical, chart, labs and discussed the procedure including the risks, benefits and alternatives for the proposed anesthesia with the patient or authorized representative who has indicated his/her understanding and acceptance.   Dental advisory given  Plan Discussed with: CRNA and Anesthesiologist  Anesthesia Plan Comments:         Anesthesia Quick Evaluation

## 2017-07-14 NOTE — Interval H&P Note (Signed)
History and Physical Interval Note:  07/14/2017 12:15 PM  Caitlyn Stephenson  has presented today for surgery, with the diagnosis of SSS, BRADYCARDIA  The various methods of treatment have been discussed with the patient and family. After consideration of risks, benefits and other options for treatment, the patient has consented to  Procedure(s): INSERTION PACEMAKER-DUAL CHAMBER (N/A) as a surgical intervention .  The patient's history has been reviewed, patient examined, no change in status, stable for surgery.  I have reviewed the patient's chart and labs.  Questions were answered to the patient's satisfaction.     [name]   

## 2017-07-14 NOTE — Anesthesia Post-op Follow-up Note (Signed)
Anesthesia QCDR form completed.        

## 2017-07-14 NOTE — Transfer of Care (Signed)
Immediate Anesthesia Transfer of Care Note  Patient: Charlett BlakeGlenda H Pennix  Procedure(s) Performed: INSERTION PACEMAKER-DUAL CHAMBER (Left Chest)  Patient Location: PACU  Anesthesia Type:General  Level of Consciousness: awake and responds to stimulation  Airway & Oxygen Therapy: Patient Spontanous Breathing and Patient connected to face mask oxygen  Post-op Assessment: Report given to RN and Post -op Vital signs reviewed and stable  Post vital signs: Reviewed and stable  Last Vitals:  Vitals:   07/14/17 1115 07/14/17 1339  BP: (!) 152/48 136/83  Pulse: (!) 36 73  Resp: 16 (!) 22  Temp: 36.7 C (!) 36.3 C  SpO2: 98% 100%    Last Pain:  Vitals:   07/14/17 1115  TempSrc: Oral         Complications: No apparent anesthesia complications

## 2017-07-14 NOTE — Op Note (Signed)
[facility]   07/14/2017                     1:39 PM  PATIENT:  Caitlyn BlakeGlenda H Stehle    PRE-OPERATIVE DIAGNOSIS:  SSS, BRADYCARDIA  POST-OPERATIVE DIAGNOSIS:  Same  PROCEDURE:  INSERTION PACEMAKER-DUAL CHAMBER  SURGEON:  Marcina MillardAlexander Margot Oriordan, MD    ANESTHESIA:     PREOPERATIVE INDICATIONS:  Caitlyn BlakeGlenda H Washington is a  77 y.o. female with a diagnosis of SSS, BRADYCARDIA who failed conservative measures and elected for surgical management.    The risks benefits and alternatives were discussed with the patient preoperatively including but not limited to the risks of infection, bleeding, cardiopulmonary complications, the need for revision surgery, among others, and the patient was willing to proceed.   OPERATIVE PROCEDURE: The patient was brought to the operating room the fasting state.  The left pectoral region was prepped and draped in usual sterile manner.  Anesthesia was obtained 1% lidocaine locally.  A 6 cm incision was performed the left pectoral region.  The pacemaker pocket was generated by electrocautery and blunt dissection.  Access was obtained to the left subclavian vein by fine-needle aspiration.  MRI compatible leads were positioned to the right ventricular apical septum ( Medtronic [id number] ) and right atrial appendage ( [employer]onic [id number] ) under fluoroscopic guidance.  After proper thresholds were obtained the leads were sutured in place.  The pacemaker leads were connected to a MRI compatible dual-chamber rate responsive pacemaker generator ( [employer]onic [id number] ).  The pacemaker pocket was irrigated with gentamicin solution.  The pacemaker generator was positioned into the pocket and the pocket was closed with 2-0 and 4-0 Vicryl, respectively.  Steri-Strips and pressure dressing were applied.  Postprocedural interrogation revealed appropriate dual-chamber atrial and ventricular sensing and pacing thresholds.  There were no periprocedural complications.

## 2017-07-14 NOTE — Interval H&P Note (Signed)
History and Physical Interval Note:  07/14/2017 12:15 PM  Caitlyn BlakeGlenda H Cleek  has presented today for surgery, with the diagnosis of SSS, BRADYCARDIA  The various methods of treatment have been discussed with the patient and family. After consideration of risks, benefits and other options for treatment, the patient has consented to  Procedure(s): INSERTION PACEMAKER-DUAL CHAMBER (N/A) as a surgical intervention .  The patient's history has been reviewed, patient examined, no change in status, stable for surgery.  I have reviewed the patient's chart and labs.  Questions were answered to the patient's satisfaction.     Mayrani Khamis [employer]

## 2017-07-14 NOTE — Anesthesia Postprocedure Evaluation (Signed)
Anesthesia Post Note  Patient: Charlett BlakeGlenda H Hogle  Procedure(s) Performed: INSERTION PACEMAKER-DUAL CHAMBER (Left Chest)  Patient location during evaluation: PACU Anesthesia Type: General Level of consciousness: awake and alert and oriented Pain management: pain level controlled Vital Signs Assessment: post-procedure vital signs reviewed and stable Respiratory status: spontaneous breathing, nonlabored ventilation and respiratory function stable Cardiovascular status: blood pressure returned to baseline and stable Postop Assessment: no signs of nausea or vomiting Anesthetic complications: no     Last Vitals:  Vitals:   07/14/17 1339 07/14/17 1354  BP: 136/83 (!) 159/64  Pulse: 73 67  Resp: (!) 22 16  Temp: (!) 36.3 C   SpO2: 100% 99%    Last Pain:  Vitals:   07/14/17 1115  TempSrc: Oral                 Aundria Bitterman

## 2017-07-14 NOTE — H&P (Signed)
Jump to Section ? Document InformationEncounter DetailsLast Filed Vital SignsPatient DemographicsPatient InstructionsPlan of TreatmentProgress NotesReason for VisitSocial HistoryVisit Diagnoses Caitlyn PianGlenda H Stephenson Encounter Summary, generated on Jan. 11, 2019 Printout Information  Document Contents Document Received Date Document Source Organization  [facility]   Patient Demographics - 77 y.o. Female; born Apr. 07, 1942   Patient Address Communication Language Race / Ethnicity  [address] [place], [place] [address] [phone] [facility](Mobile) [phone] (Home) English (Preferred) White / Not Hispanic or Latino   Reason for Visit   Reason Comments  Follow-up per Tumey low heart rate  Chest Pain sore in breast from a fall last summer  Dizziness when i get up  Fatigue caregiver for husband     Consultation (Emergency) Consultation (Emergency)  Status Reason Specialty Diagnoses / Procedures Referred By Contact Referred To Contact  Pending Review  Cardiovascular Disease / Cardiology Diagnoses  Bradycardia   Dayton Bailiffumey, Robert John, PA  [address] [facility] [address]  [facility]  [place], [place] [address]  Phone: [phone]  Fax: [phone]         Encounter Details   Date Type Department Care Team Description  07/09/2017 [facility]  [address]  [place], [place] [address]  [phone]  Dayton Bailiffumey, Robert John, [place]  [address]  [facility]  [place], [place] [address]  [phone]  [phone] (Fax)    Marcina MillardParaschos, Dontavious Emily, MD  [address]  [facility]  [place], [place] [address]  [phone]  [phone] (Fax)  Essential hypertension (Primary Dx);  Bundle branch block, left;  Other hyperlipidemia;  CKD (chronic kidney disease) stage 3, GFR 30-59 ml/min (CMS-HCC);  Controlled type 2 diabetes mellitus with  complication, without long-term current use of insulin , unspecified (CMS-HCC);  Sick sinus syndrome (CMS-HCC)   Social History - as of this encounter  Tobacco Use Types Packs/Day Years Used Date  Never Smoker      Smokeless Tobacco: Never Used      Alcohol Use Drinks/Week oz/Week Comments  No 0 Standard drinks or equivalent  0.0     Sex Assigned at [employer]irth Date Recorded  Not on file    Job Start Date Occupation Industry  Not on file Not on file Not on file   Travel History Travel Start Travel End  No recent travel history available.     Last Filed Vital Signs - in this encounter  Vital Sign Reading Time Taken  Blood Pressure 142/62 07/09/2017 10:49 AM EST  Pulse 34 07/09/2017 10:49 AM EST  Temperature - -  Respiratory Rate - -  Oxygen Saturation 98% 07/09/2017 10:49 AM EST  Inhaled Oxygen Concentration - -  Weight 69.9 kg (154 lb) 07/09/2017 10:49 AM EST  Height 160 cm (5\' 3" ) 07/09/2017 10:49 AM EST  Body Mass Index 27.28 07/09/2017 10:49 AM EST   Patient Instructions - in this encounter  Patient Instructions Marcina MillardParaschos, Jordain Radin, MD - 07/09/2017 11:00 AM EST   Patient Education    DASH Diet: Care Instructions Your Care Instructions  The DASH diet is an eating plan that can help lower your blood pressure. DASH stands for Dietary Approaches to Stop Hypertension. Hypertension is high blood pressure. The DASH diet focuses on eating foods that are high in calcium, potassium, and magnesium. These nutrients can lower blood pressure. The foods that are highest in these nutrients are fruits, vegetables, low-fat dairy products, nuts, seeds, and legumes. But taking calcium, potassium, and magnesium supplements  instead of eating foods that are high in those nutrients does not have the same effect. The DASH diet also includes whole grains, fish, and poultry. The DASH diet is one of several lifestyle changes your doctor may recommend to lower your high blood pressure.  Your doctor may also want you to decrease the amount of sodium in your diet. Lowering sodium while following the DASH diet can lower blood pressure even further than just the DASH diet alone. Follow-up care is a key part of your treatment and safety. Be sure to make and go to all appointments, and call your doctor if you are having problems. It's also a good idea to know your test results and keep a list of the medicines you take. How can you care for yourself at home? Following the DASH diet  Eat 4 to 5 servings of fruit each day. A serving is 1 medium-sized piece of fruit,  cup chopped or canned fruit, 1/4 cup dried fruit, or 4 ounces ( cup) of fruit juice. Choose fruit more often than fruit juice.  Eat 4 to 5 servings of vegetables each day. A serving is 1 cup of lettuce or raw leafy vegetables,  cup of chopped or cooked vegetables, or 4 ounces ( cup) of vegetable juice. Choose vegetables more often than vegetable juice.  Get 2 to 3 servings of low-fat and fat-free dairy each day. A serving is 8 ounces of milk, 1 cup of yogurt, or 1  ounces of cheese.  Eat 6 to 8 servings of grains each day. A serving is 1 slice of bread, 1 ounce of dry cereal, or  cup of cooked rice, pasta, or cooked cereal. Try to choose whole-grain products as much as possible.  Limit lean meat, poultry, and fish to 2 servings each day. A serving is 3 ounces, about the size of a deck of cards.  Eat 4 to 5 servings of nuts, seeds, and legumes (cooked dried beans, lentils, and split peas) each week. A serving is 1/3 cup of nuts, 2 tablespoons of seeds, or  cup of cooked beans or peas.  Limit fats and oils to 2 to 3 servings each day. A serving is 1 teaspoon of vegetable oil or 2 tablespoons of salad dressing.  Limit sweets and added sugars to 5 servings or less a week. A serving is 1 tablespoon jelly or jam,  cup sorbet, or 1 cup of lemonade.  Eat less than 2,300 milligrams (mg) of sodium a day. If you limit your  sodium to 1,500 mg a day, you can lower your blood pressure even more. Tips for success  Start small. Do not try to make dramatic changes to your diet all at once. You might feel that you are missing out on your favorite foods and then be more likely to not follow the plan. Make small changes, and stick with them. Once those changes become habit, add a few more changes.  Try some of the following: ? Make it a goal to eat a fruit or vegetable at every meal and at snacks. This will make it easy to get the recommended amount of fruits and vegetables each day. ? Try yogurt topped with fruit and nuts for a snack or healthy dessert. ? Add lettuce, tomato, cucumber, and onion to sandwiches. ? Combine a ready-made pizza crust with low-fat mozzarella cheese and lots of vegetable toppings. Try using tomatoes, squash, spinach, broccoli, carrots, cauliflower, and onions. ? Have a variety of cut-up vegetables with a  low-fat dip as an appetizer instead of chips and dip. ? Sprinkle sunflower seeds or chopped almonds over salads. Or try adding chopped walnuts or almonds to cooked vegetables. ? Try some vegetarian meals using beans and peas. Add garbanzo or kidney beans to salads. Make burritos and tacos with mashed pinto beans or black beans. Where can you learn more? Log in to your [facility] account at [email or web address] and click on top menu option "Health" then select "Search Medical Library". Enter [phone] in the search box and click the magnify glass to learn more about "DASH Diet: Care Instructions." Current as of: June 05, 2016 Content Version: 11.7  2006-2018 [employer]. Care instructions adapted under license by your healthcare professional. If you have questions about a medical condition or this instruction, always ask your healthcare professional. Healthwise, Incorporated disclaims any warranty or liability for your use of this information.    Patient Education    Learning  About High Cholesterol What is high cholesterol?  Cholesterol is a type of fat in your blood. It is needed for many body functions, such as making new cells. Cholesterol is made by your body. It also comes from food you eat. If you have too much cholesterol, it starts to build up in your arteries. This is called hardening of the arteries, or atherosclerosis. High cholesterol raises your risk of a heart attack and stroke. There are different types of cholesterol. LDL is the "bad" cholesterol. High LDL can raise your risk for heart disease, heart attack, and stroke. HDL is the "good" cholesterol. High HDL is linked with a lower risk for heart disease, heart attack, and stroke. Your cholesterol levels help your doctor find out your risk for having a heart attack or stroke. How can you prevent high cholesterol? A heart-healthy lifestyle can help you prevent high cholesterol. This lifestyle helps lower your risk for a heart attack and stroke.  Eat heart-healthy foods. ? Eat fruits, vegetables, whole grains (like oatmeal), dried beans and peas, nuts and seeds, soy products (like tofu), and fat-free or low-fat dairy products. ? Replace butter, margarine, and hydrogenated or partially hydrogenated oils with olive and canola oils. (Canola oil margarine without trans fat is fine.) ? Replace red meat with fish, poultry, and soy protein (like tofu). ? Limit processed and packaged foods like chips, crackers, and cookies.  Be active. Exercise can improve your cholesterol level. Get at least 30 minutes of exercise on most days of the week. Walking is a good choice. You also may want to do other activities, such as running, swimming, cycling, or playing tennis or team sports.  Stay at a healthy weight. Lose weight if you need to.  Don't smoke. If you need help quitting, talk to your doctor about stop-smoking programs and medicines. These can increase your chances of quitting for good. How is high cholesterol  treated? The goal of treatment is to reduce your chances of having a heart attack or stroke. The goal is not to lower your cholesterol numbers only.  You may make lifestyle changes, such as eating healthy foods, not smoking, losing weight, and being more active.  You may have to take medicine. Follow-up care is a key part of your treatment and safety. Be sure to make and go to all appointments, and call your doctor if you are having problems. It's also a good idea to know your test results and keep a list of the medicines you take. Where can you learn more? Log in to your  [facility] account at [email or web address] and click on top menu option "Health" then select "Search Medical Library". Enter [phone] in the search box and click the magnify glass to learn more about "Learning About High Cholesterol." Current as of: Nov 08, 2015 Content Version: 11.7  2006-2018 Healthwise, Incorporated. Care instructions adapted under license by your healthcare professional. If you have questions about a medical condition or this instruction, always ask your healthcare professional. Healthwise, Incorporated disclaims any warranty or liability for your use of this information.     Electronically Signed by Marcina Millard, MD on 07/09/2017 11:00 AM EST       Progress Notes - in this encounter  Marcina Millard, MD - 07/09/2017 11:00 AM EST Formatting of this note might be different from the original. Established Patient Visit   Chief Complaint: Chief Complaint  Patient presents with  . Follow-up  per Tumey low heart rate  . Chest Pain  sore in breast from a fall last summer  . Dizziness  when i get up  . Fatigue  caregiver for husband  Date of Service: 07/09/2017 Date of Birth: 08-May-1941 PCP: Sallee Provencal, MD  History of Present Illness: Ms. Cork is a 77 y.o.female patient who returns for   1. Sinus bradycardia 2. Essential hypertension 3. Hyperlipidemia 4. Left bundle  branch block  The patient was seen earlier today by her primary care provider and is referred for bradycardia. The patient has known history of sinus bradycardia, with heart rates in the 40s. The patient has been doing well, denies chest pain shortness of breath. She denies palpitations or heart racing. She denies peripheral edema. She denies presyncope or syncope. Yesterday, the patient did feel fatigued with low energy. This morning, she had an episode lightheadedness however felt better after eating oatmeal for breakfast. She has resumed her usual activity, in particular taking care of her husband. EKG revealed sinus bradycardia at a rate of 42 bpm with intermittent premature ventricular complexes. Patient appears clinically him chemically stable. Blood pressure was 142/62. She underwent stress echocardiogram 04/08/2016, exercised 3 minutes and 2 seconds on a Bruce protocol without chest pain or ECG changes. At baseline, 2D echocardiogram revealed normal left ventricular function with LVEF greater than 55%. At peak exercise, there was no echocardiographic evidence for ischemia.  The patient has essential hypertension, systolic blood pressure mildly elevated, on furosemide and lisinopril, which is well tolerated without apparent side effects. The patient follows a low-sodium, no added salt diet.  The patient has hyperlipidemia, LDL cholesterol was 77 on 9/60/4540, on lovastatin, which is well tolerated without apparent side effects, followed by her primary care provider. The patient follows a low-cholesterol, low-fat diet.  Past Medical and Surgical History  Past Medical History Past Medical History:  Diagnosis Date  . Angioedema 01/2009  possible; lisinopril stopped originally, but resumed without issue  . Bradycardia 05/19/2015  Holter 11/16 with rare SVT, occasional PVCs. Intermittent 1st degree AVB, intermittent bundle branch block, with periodic HR <40. Cardiology consulted.  . Bundle branch  block, left 05/11/2015  . CKD (chronic kidney disease) stage 3, GFR 30-59 ml/min (CMS-HCC) 05/09/2016  . Controlled type 2 diabetes mellitus with complication, without long-term current use of insulin , unspecified (CMS-HCC) 05/09/2016  . Diabetes (CMS-HCC)  adult onset diabetes mellitus  . Gastroesophageal reflux disease with hiatal hernia  with hiatal hernia  . History of asthma  with allergic component, although no real symptoms with this since 1993, when she tore out the carpet, blinds,  and curtains in her house  . Hyperlipidemia, unspecified  . Hypertension  . Hypothyroidism, unspecified  with previous thyroidectomy  . Iron deficiency anemia  stool heme positive 2014, GI consultation strongly recommended, with pt declining due to her husband's health. UPEP/SPEP negative  . Osteoarthritis  mainly involving knees; followed by Dr. Neva Seat. Status post Synvisc injections  . Reflux  with hiatal hernia  . Severe headache 09/2006  seen in the ER, with CT negative; lumbar puncture unremarkable  . Type 2 diabetes mellitus without complication (CMS-HCC) 04/25/2014   Past Surgical History She has a past surgical history that includes Cholecystectomy; History of right breast biopsy, benign per report; Hysterectomy with BSO; Status post umbilical hernia repair by Dr. Renda Rolls (2007); Right foot fusion (03/2012); Tonsillectomy; Knee arthroscopy; S/P thyroidectomy; Cholecystectomy; Hysterectomy; and Hernia repair.   Medications and Allergies  Current Medications  Current Outpatient Medications  Medication Sig Dispense Refill  . acetaminophen (TYLENOL) 500 mg capsule Take 1,000 mg by mouth every 8 (eight) hours as needed for Pain. Extra strength  . aspirin 325 MG EC tablet Take 325 mg by mouth once daily.  . cholecalciferol (CHOLECALCIFEROL) 1,000 unit tablet Take by mouth once daily.   . diclofenac (VOLTAREN) 1 % topical gel Apply 4 g topically 4 (four) times daily. 400 g 3  .  ferrous sulfate (IRON, FERROUS SULFATE,) 325 (65 FE) MG tablet Take 325 mg by mouth daily with breakfast.  . FUROsemide (LASIX) 80 MG tablet Take 80 mg by mouth every other day.   Marland Kitchen glimepiride (AMARYL) 1 MG tablet TAKE ONE TABLET BY MOUTH ONCE DAILY 30 tablet 5  . lansoprazole (PREVACID) 15 MG DR capsule Take 15 mg by mouth 2 (two) times daily.  Marland Kitchen levothyroxine (SYNTHROID, LEVOTHROID) 100 MCG tablet TAKE ONE TABLET BY MOUTH ONCE DAILY. TAKE ON AN EMPTY STOMACH WITH A GLASS OF WATER AT LEAST 30-60 MINUTES BEFORE BREAKFAST 90 tablet 4  . lisinopril (PRINIVIL,ZESTRIL) 20 MG tablet Take 1 tablet (20 mg total) by mouth once daily. 90 tablet 1  . lovastatin (MEVACOR) 40 MG tablet Take 1 tablet (40 mg total) by mouth daily with dinner. 30 tablet 11  . traMADol (ULTRAM) 50 mg tablet Take 1 tablet (50 mg total) by mouth every 6 (six) hours as needed for Pain for up to 60 doses. 40 tablet 1  . traMADol (ULTRAM) 50 mg tablet Take 1 tablet (50 mg total) by mouth every 6 (six) hours as needed for Pain for up to 60 doses. 30 tablet 1   No current facility-administered medications for this visit.   Allergies: Advil [ibuprofen]; Aleve [naproxen sodium]; Celebrex [celecoxib]; Hydrocodone-acetaminophen; Mobic [meloxicam]; Phentermine; and Shellfish containing products  Social and Family History  Social History reports that she has never smoked. She has never used smokeless tobacco. She reports that she does not drink alcohol.  Family History Family History  Problem Relation Age of Onset  . Diabetes Unknown  . Alzheimer's disease Mother  . Diabetes Mother  . Diabetes Father  . High blood pressure (Hypertension) Father  . Seizures Father  . Myocardial Infarction (Heart attack) Brother  . Diabetes Brother   Review of Systems   Review of Systems: The patient denies chest pain, without shortness of breath, orthopnea, paroxysmal nocturnal dyspnea, pedal edema, palpitations, heart racing, presyncope,  syncope. Review of 12 Systems is negative except as described above.  Physical Examination   Vitals: BP 142/62  Pulse (!) 34  Ht 160 cm (5\' 3" )  Wt 69.9 kg (154 lb)  LMP (LMP Unknown)  SpO2 98%  BMI 27.28 kg/m  Ht:160 cm (5\' 3" ) Wt:69.9 kg (154 lb) ZOX:WRUE surface area is 1.76 meters squared. Body mass index is 27.28 kg/m.  HEENT: Pupils equally reactive to light and accomodation  Neck: Supple without thyromegaly, carotid pulses 2+ Lungs: clear to auscultation bilaterally; no wheezes, rales, rhonchi Heart: Regular rate and rhythm. No gallops, murmurs or rub Abdomen: soft nontender, nondistended, with normal bowel sounds Extremities: no cyanosis, clubbing, or edema Peripheral Pulses: 2+ in all extremities, 2+ femoral pulses bilaterally  Assessment   77 y.o. female with  1. Essential hypertension  2. Bundle branch block, left  3. Other hyperlipidemia  4. CKD (chronic kidney disease) stage 3, GFR 30-59 ml/min (CMS-HCC)  5. Controlled type 2 diabetes mellitus with complication, without long-term current use of insulin , unspecified (CMS-HCC)  6. Sick sinus syndrome (CMS-HCC)   77 year old female with sinus bradycardia, presents today with recent symptoms of fatigue and lightheadedness, without hypertension, or feeling of presyncope or syncope. EKG revealed marked sinus bradycardia, blood pressure 142/62. Stress echocardiogram revealed normal left ventricular function, without evidence for ischemia. The patient has essential hypertension, systolic blood pressure mildly elevated on current BP medications. Patient has hyperlipidemia with good control of LDL cholesterol on lovastatin.  Plan   1. Continue current medications 2. Counseled patient about low-sodium diet 3. DASH diet printed instructions given to the patient 4. Counseled patient about low-cholesterol diet 5. Continue lovastatin for hyperlipidemia management  6. Low-fat and cholesterol diet printed instructions given  to the patient 7. Proceed with dual-chamber pacemaker implantation. The risks, benefits alternatives of dual-chamber pacemaker implantation were explained to the patient and informed consent was obtained.  No orders of the defined types were placed in this encounter.  Return in about 2 weeks (around 07/23/2017), or after pacemaker.  Marcina Millard, MD PhD [facility]    Electronically Signed by Marcina Millard, MD on 07/09/2017 11:00 AM EST     Plan of Treatment - as of this encounter  Upcoming Encounters Upcoming Encounters  Date Type Specialty Care Team Description  07/24/2017 Office Visit Cardiology Marcina Millard, MD  [address]  [facility]  [place], [place] [address]  [phone]  [phone] (Fax)    09/09/2017 Ancillary Orders Lab Sallee Provencal, MD  [address]  [facility] [place], [place] [address]  [phone]  [phone]    09/16/2017 Office Visit Internal Medicine Sallee Provencal, MD  [address]  [facility] [place], [place] [address]  [phone]  [phone] (Fax)     Visit Diagnoses - in this encounter  Diagnosis  Essential hypertension - Primary   Bundle branch block, left  Other left bundle branch block   Other hyperlipidemia   CKD (chronic kidney disease) stage 3, GFR 30-59 ml/min (CMS-HCC)  Chronic kidney disease, Stage III (moderate)   Controlled type 2 diabetes mellitus with complication, without long-term current use of insulin , unspecified (CMS-HCC)   Sick sinus syndrome (CMS-HCC)  Sinoatrial node dysfunction    Images Document Information  Primary Care Provider Other Service Providers Document Coverage Dates  Teena Dunk MD (Nov 08, 2013 - Present) [phone] (Work) [phone] (Fax) [address] [facility] [place] [address]   Jan. 09, 2019   Custodian Organization  [facility] [address] [place], [place] [address]   Encounter Providers Encounter Date  Marcina Millard  MD (Attending) [phone] (Work) [phone] (Fax) [address] [facility] [place], [place] [address]  Jan. 09, 2019    Show All Sections

## 2017-07-15 ENCOUNTER — Encounter: Payer: Self-pay | Admitting: Cardiology

## 2017-07-15 DIAGNOSIS — I495 Sick sinus syndrome: Secondary | ICD-10-CM | POA: Diagnosis not present

## 2017-07-15 MED ORDER — CEPHALEXIN 250 MG PO CAPS: 500.0000 mg | ORAL_CAPSULE | Freq: Two times a day (BID) | ORAL | 0 refills | Status: DC

## 2017-07-15 NOTE — Care Management Obs Status (Signed)
MEDICARE OBSERVATION STATUS NOTIFICATION   Patient Details  Name: Caitlyn BlakeGlenda H Ade MRN: [id number] Date of Birth: 10/25/1940   Medicare Observation Status Notification Given:  No Discharge order placed in < 24hr of being placed on observation  Eber HongGreene, Thayer Embleton R, RN 07/15/2017, 9:45 AM

## 2017-07-15 NOTE — Progress Notes (Signed)
Iv and tele removed. Prescriptions given to pt. Discharge instructions given to pt. Pt has not reported any pain. Pt has no further concerns at this time.

## 2017-07-15 NOTE — Discharge Summary (Signed)
Physician Discharge Summary  Patient ID: Caitlyn Stephenson MRN: [id number] DOB/AGE: 78/12/1940 78 y.o.  Admit date: 07/14/2017 Discharge date: 07/15/2017  Primary Discharge Diagnosis sick sinus syndrome Secondary Discharge Diagnosis second-degree AV block  Significant Diagnostic Studies: yes  Consults: None  Hospital Course: The patient underwent elective dual-chamber pacemaker implantation on 07/15/2017 without periprocedural complications.  She was returned to telemetry where she remained in dual-chamber pacing mode.  Postprocedural chest x-ray revealed pacing wires without evidence for pneumothorax.  On the morning of 07/15/2017, the patient was ambulating without difficulty and was discharged home.   Discharge Exam: Blood pressure (!) 145/51, pulse 60, temperature 97.6 F (36.4 C), temperature source Oral, resp. rate 20, height 5\' 3"  (1.6 m), weight 69.9 kg (154 lb), SpO2 96 %.   General appearance: alert Head: Normocephalic, without obvious abnormality, atraumatic Eyes: conjunctivae/corneas clear. PERRL, EOM's intact. Fundi benign. Ears: normal TM's and external ear canals both ears Nose: Nares normal. Septum midline. Mucosa normal. No drainage or sinus tenderness. Throat: lips, mucosa, and tongue normal; teeth and gums normal Neck: no adenopathy, no carotid bruit, no JVD, supple, symmetrical, trachea midline and thyroid not enlarged, symmetric, no tenderness/mass/nodules Back: symmetric, no curvature. ROM normal. No CVA tenderness. Resp: clear to auscultation bilaterally Chest wall: no tenderness Cardio: regular rate and rhythm, S1, S2 normal, no murmur, click, rub or gallop GI: soft, non-tender; bowel sounds normal; no masses,  no organomegaly Extremities: extremities normal, atraumatic, no cyanosis or edema Pulses: 2+ and symmetric Skin: Skin color, texture, turgor normal. No rashes or lesions Lymph nodes: Cervical, supraclavicular, and axillary nodes normal. Neurologic:  Grossly normal Labs:   Lab Results  Component Value Date   WBC 7.6 07/11/2017   HGB 14.1 07/11/2017   HCT 43.5 07/11/2017   MCV 91.3 07/11/2017   PLT 241 07/11/2017    Recent Labs  Lab 07/11/17 1346  NA 143  K 4.6  CL 109  CO2 25  BUN 21*  CREATININE 1.02*  CALCIUM 9.2  GLUCOSE 79      Radiology: No pneumothorax EKG: Atrial and ventricular pacing  FOLLOW UP PLANS AND APPOINTMENTS  Allergies as of 07/15/2017      Reactions   Fish Allergy Other (See Comments)   Wheezing.  All kinds of fish cause SOB and wheezing Specifically oysters and flounder. No problems with iodine topically   Garlic Other (See Comments)   wheezing   Other Other (See Comments)   Lettuce wheezing   Shellfish Allergy Other (See Comments)   wheezing   Advil [ibuprofen] Rash   Aleve, ibuprofen,motrin       Medication List    TAKE these medications   acetaminophen 500 MG tablet Commonly known as:  TYLENOL Take 500-1,000 mg by mouth every 8 (eight) hours as needed for moderate pain.   aspirin EC 325 MG tablet Take 325 mg by mouth daily.   Biotin 10 MG Caps Take 10 mg by mouth daily.   cephALEXin 250 MG capsule Commonly known as:  KEFLEX Take 2 capsules (500 mg total) by mouth 2 (two) times daily.   cholecalciferol 400 units Tabs tablet Commonly known as:  VITAMIN D Take 400 Units by mouth daily.   furosemide 80 MG tablet Commonly known as:  LASIX Take 80 mg by mouth daily as needed for edema.   glimepiride 1 MG tablet Commonly known as:  AMARYL Take 0.5 mg by mouth daily with breakfast.   lansoprazole 15 MG capsule Commonly known as:  PREVACID Take  15 mg by mouth daily at 12 noon.   levothyroxine 100 MCG tablet Commonly known as:  SYNTHROID, LEVOTHROID Take 100 mcg by mouth daily before breakfast.   lisinopril 20 MG tablet Commonly known as:  PRINIVIL,ZESTRIL Take 20 mg by mouth daily.   lovastatin 20 MG tablet Commonly known as:  MEVACOR Take 40 mg by mouth at  bedtime.   OVER THE COUNTER MEDICATION Take 250 mg by mouth daily. Keratin otc supplement      Follow-up Information    Jannine Abreu, MD Follow up in 1 week(s).   Specialty:  Cardiology Contact information: [address] [facility] [place] [address] [phone]           BRING ALL MEDICATIONS WITH YOU TO FOLLOW UP APPOINTMENTS  Time spent with patient to include physician time: 25 minutes Signed:  Marcina Millard MD, PhD, [facility] 07/15/2017, 7:56 AM

## 2017-07-15 NOTE — Care Management Obs Status (Deleted)
MEDICARE OBSERVATION STATUS NOTIFICATION   Patient Details  Name: Charlett BlakeGlenda H Nazzaro MRN: [id number] Date of Birth: 04/09/1941   Medicare Observation Status Notification Given:  Yes  Discharge order placed in < 24hr of being placed on observation Eber HongGreene, Donterius Filley R, RN 07/15/2017, 8:13 AM

## 2017-07-15 NOTE — Discharge Instructions (Signed)
Do not lift left arm above head.  Patient may shower on 07/16/2017.

## 2017-07-24 DIAGNOSIS — Z950 Presence of cardiac pacemaker: Secondary | ICD-10-CM | POA: Insufficient documentation

## 2017-08-15 ENCOUNTER — Emergency Department: Payer: Medicare Other

## 2017-08-15 ENCOUNTER — Emergency Department
Admission: EM | Admit: 2017-08-15 | Discharge: 2017-08-16 | Disposition: A | Discharge status: Home / Self Care | Payer: Medicare Other | Attending: Emergency Medicine | Admitting: Emergency Medicine

## 2017-08-15 DIAGNOSIS — E119 Type 2 diabetes mellitus without complications: Secondary | ICD-10-CM | POA: Insufficient documentation

## 2017-08-15 DIAGNOSIS — Z7984 Long term (current) use of oral hypoglycemic drugs: Secondary | ICD-10-CM | POA: Diagnosis not present

## 2017-08-15 DIAGNOSIS — R0789 Other chest pain: Secondary | ICD-10-CM | POA: Insufficient documentation

## 2017-08-15 DIAGNOSIS — E039 Hypothyroidism, unspecified: Secondary | ICD-10-CM | POA: Insufficient documentation

## 2017-08-15 DIAGNOSIS — Z7982 Long term (current) use of aspirin: Secondary | ICD-10-CM | POA: Diagnosis not present

## 2017-08-15 DIAGNOSIS — Z950 Presence of cardiac pacemaker: Secondary | ICD-10-CM | POA: Insufficient documentation

## 2017-08-15 DIAGNOSIS — I10 Essential (primary) hypertension: Secondary | ICD-10-CM | POA: Diagnosis not present

## 2017-08-15 DIAGNOSIS — Z9049 Acquired absence of other specified parts of digestive tract: Secondary | ICD-10-CM | POA: Insufficient documentation

## 2017-08-15 DIAGNOSIS — Z79899 Other long term (current) drug therapy: Secondary | ICD-10-CM | POA: Insufficient documentation

## 2017-08-15 LAB — CBC WITH DIFFERENTIAL/PLATELET
BASOS ABS: 0 10*3/uL (ref 0–0.1)
Basophils Relative: 1 %
EOS PCT: 2 %
Eosinophils Absolute: 0.2 10*3/uL (ref 0–0.7)
HEMATOCRIT: 41.6 % (ref 35.0–47.0)
Hemoglobin: 13.7 g/dL (ref 12.0–16.0)
LYMPHS ABS: 2.8 10*3/uL (ref 1.0–3.6)
LYMPHS PCT: 33 %
MCH: 29.3 pg (ref 26.0–34.0)
MCHC: 32.9 g/dL (ref 32.0–36.0)
MCV: 89.3 fL (ref 80.0–100.0)
MONO ABS: 1 10*3/uL — AB (ref 0.2–0.9)
Monocytes Relative: 12 %
Neutro Abs: 4.4 10*3/uL (ref 1.4–6.5)
Neutrophils Relative %: 52 %
PLATELETS: 251 10*3/uL (ref 150–440)
RBC: 4.66 MIL/uL (ref 3.80–5.20)
RDW: 13.6 % (ref 11.5–14.5)
WBC: 8.4 10*3/uL (ref 3.6–11.0)

## 2017-08-15 LAB — COMPREHENSIVE METABOLIC PANEL
ALT: 13 U/L — AB (ref 14–54)
AST: 22 U/L (ref 15–41)
Albumin: 3.8 g/dL (ref 3.5–5.0)
Alkaline Phosphatase: 64 U/L (ref 38–126)
Anion gap: 9 (ref 5–15)
BILIRUBIN TOTAL: 0.4 mg/dL (ref 0.3–1.2)
BUN: 13 mg/dL (ref 6–20)
CALCIUM: 8.3 mg/dL — AB (ref 8.9–10.3)
CO2: 23 mmol/L (ref 22–32)
CREATININE: 0.94 mg/dL (ref 0.44–1.00)
Chloride: 107 mmol/L (ref 101–111)
GFR, EST NON AFRICAN AMERICAN: 57 mL/min — AB (ref >60)
Glucose, Bld: 142 mg/dL — ABNORMAL HIGH (ref 65–99)
Potassium: 3.8 mmol/L (ref 3.5–5.1)
Sodium: 139 mmol/L (ref 135–145)
Total Protein: 6.7 g/dL (ref 6.5–8.1)

## 2017-08-15 LAB — LIPASE, BLOOD: LIPASE: 46 U/L (ref 11–51)

## 2017-08-15 LAB — TROPONIN I

## 2017-08-15 NOTE — ED Triage Notes (Signed)
Pt presents today via [facility] for chestpain. Pt states she leanded over to place slippers on and chest starting hurting. Pt is A/ox4. Pt had pace maker placed 07-14-17. Pt describes as sternum pain that does not radiate.

## 2017-08-15 NOTE — Discharge Instructions (Addendum)
Return to the emergency room for any new or worrisome symptoms including change in your chest pain increased chest pain shortness of breath, nausea, vomiting, chest pain or shortness of breath when you exert yourself, lightheadedness or you feel worse in any way.  It is impossible in the emergency room to ensure 100% that you are not having any heart issue but your  workup thus far is negative.  You would prefer to go home than be admitted which is certainly a reasonable choice however, it is important that you follow closely with cardiology and return to the emergency room if you feel worse in any way.

## 2017-08-15 NOTE — ED Provider Notes (Addendum)
[facility] Emergency Department Provider Note  ____________________________________________   I have reviewed the triage vital signs and the nursing notes. Where available I have reviewed prior notes and, if possible and indicated, outside hospital notes.    HISTORY  Chief Complaint Chest Pain    HPI Caitlyn Stephenson is a 77 y.o. female today complaining of chest wall pain.  Is in the left side of the chest, just by the sternum and the costochondral margin.  She states she was reaching over and started having that discomfort.  Patient states that it is nonradiating.  Worse when she touches it worse when she changes position.  Had aspirin and the pain is nearly gone unless he moves the wrong way or touches it.  No fever no chills no cough, no exertional symptoms never had stents placed does have a pacemaker for SSS.  Patient does not feel any way unwell at this time,  Sharp nonradiating pain nothing makes it better except for the aspirin she had en route.  Nothing makes it worse, mild pain at this time 1 out of 10 only when she touches it It is nonpleuritic, no shingles or rashes, no leg swelling no recent travel no history of PE or DVT Past Medical History:  Diagnosis Date  . Anemia   . Complication of anesthesia    difficult to wake up  . Diabetes mellitus without complication (HCC)   . Dysrhythmia 07/2017   SSS,bradycardia  . Edema   . GERD (gastroesophageal reflux disease)   . High cholesterol   . Hypertension   . Hypothyroidism   . Thyroid disease     Patient Active Problem List   Diagnosis Date Noted  . Sick sinus syndrome (HCC) 07/14/2017    Past Surgical History:  Procedure Laterality Date  . ABDOMINAL HYSTERECTOMY  1983  . APPENDECTOMY    . BREAST BIOPSY Right 1965   cyst removed  . CHOLECYSTECTOMY    . PACEMAKER INSERTION Left 07/14/2017   Procedure: INSERTION PACEMAKER-DUAL CHAMBER;  Surgeon: Marcina Millard, MD;  Location: [facility]  ORS;  Service: Cardiovascular;  Laterality: Left;  . THYROID SURGERY  1970    Prior to Admission medications   Medication Sig Start Date End Date Taking? Authorizing Provider  acetaminophen (TYLENOL) 500 MG tablet Take 500-1,000 mg by mouth every 8 (eight) hours as needed for moderate pain.    [provider]  aspirin EC 325 MG tablet Take 325 mg by mouth daily.    [provider]  Biotin 10 MG CAPS Take 10 mg by mouth daily.    [provider]  cephALEXin (KEFLEX) 250 MG capsule Take 2 capsules (500 mg total) by mouth 2 (two) times daily. 07/15/17   Paraschos, Alexander, MD  cholecalciferol (VITAMIN D) 400 units TABS tablet Take 400 Units by mouth daily.    [provider]  furosemide (LASIX) 80 MG tablet Take 80 mg by mouth daily as needed for edema.    [provider]  glimepiride (AMARYL) 1 MG tablet Take 0.5 mg by mouth daily with breakfast.    [provider]  lansoprazole (PREVACID) 15 MG capsule Take 15 mg by mouth daily at 12 noon.    [provider]  levothyroxine (SYNTHROID, LEVOTHROID) 100 MCG tablet Take 100 mcg by mouth daily before breakfast.    [provider]  lisinopril (PRINIVIL,ZESTRIL) 20 MG tablet Take 20 mg by mouth daily.    [provider]  lovastatin (MEVACOR) 20 MG  tablet Take 40 mg by mouth at bedtime.    [provider]  OVER THE COUNTER MEDICATION Take 250 mg by mouth daily. Keratin otc supplement    [provider]    Allergies Fish allergy; Garlic; Other; Shellfish allergy; and Advil [ibuprofen]  No family history on file.  Social History Social History   Tobacco Use  . Smoking status: Never Smoker  . Smokeless tobacco: Never Used  Substance Use Topics  . Alcohol use: No    Frequency: Never  . Drug use: No    Review of Systems Constitutional: No fever/chills Eyes: No visual changes. ENT: No sore throat. No stiff neck no neck pain Cardiovascular: +  chest pain. Respiratory: Denies shortness of breath. Gastrointestinal:   no vomiting.  No diarrhea.  No constipation. Genitourinary: Negative for dysuria. Musculoskeletal: Negative lower extremity swelling Skin: Negative for rash. Neurological: Negative for severe headaches, focal weakness or numbness.   ____________________________________________   PHYSICAL EXAM:  VITAL SIGNS: ED Triage Vitals  Enc Vitals Group     BP 08/15/17 2102 (!) 141/62     Pulse Rate 08/15/17 2102 60     Resp --      Temp 08/15/17 2102 97.9 F (36.6 C)     Temp Source 08/15/17 2102 Oral     SpO2 08/15/17 2102 96 %     Weight 08/15/17 2100 153 lb (69.4 kg)     Height 08/15/17 2100 5\' 3"  (1.6 m)     Head Circumference --      Peak Flow --      Pain Score 08/15/17 2059 6     Pain Loc --      Pain Edu? --      Excl. in GC? --     Constitutional: Alert and oriented. Well appearing and in no acute distress. Eyes: Conjunctivae are normal Head: Atraumatic HEENT: No congestion/rhinnorhea. Mucous membranes are moist.  Oropharynx non-erythematous Neck:   Nontender with no meningismus, no masses, no stridor Cardiovascular: Normal rate, regular rhythm. Grossly normal heart sounds.  Good peripheral circulation. Chest: There is tenderness palpation along the costochondral margin if necessary patient states "ouch that the pain right there" and post back.  There is no flail chest crepitus or rib fractures palpated. Respiratory: Normal respiratory effort.  No retractions. Lungs CTAB. Abdominal: Soft and nontender. No distention. No guarding no rebound Back:  There is no focal tenderness or step off.  there is no midline tenderness there are no lesions noted. there is no CVA tenderness Musculoskeletal: No lower extremity tenderness, no upper extremity tenderness. No joint effusions, no DVT signs strong distal pulses no edema Neurologic:  Normal speech and language. No gross focal neurologic deficits are  appreciated.  Skin:  Skin is warm, dry and intact. No rash noted. Psychiatric: Mood and affect are normal. Speech and behavior are normal.  ____________________________________________   LABS (all labs ordered are listed, but only abnormal results are displayed)  Labs Reviewed  CBC WITH DIFFERENTIAL/PLATELET - Abnormal; Notable for the following components:      Result Value   Monocytes Absolute 1.0 (*)    All other components within normal limits  COMPREHENSIVE METABOLIC PANEL  LIPASE, BLOOD  TROPONIN I    Pertinent labs  results that were available during my care of the patient were reviewed by me and considered in my medical decision making (see chart for details). ____________________________________________  EKG  I personally interpreted any EKGs ordered by me or triage AV paced  rhythm, rate 60 ____________________________________________  RADIOLOGY  Pertinent labs & imaging results that were available during my care of the patient were reviewed by me and considered in my medical decision making (see chart for details). If possible, patient and/or family made aware of any abnormal findings.  Dg Chest 2 View  Result Date: 08/15/2017 CLINICAL DATA:  Chest pain after leaning over to put slippers on. EXAM: CHEST  2 VIEW COMPARISON:  07/14/2017 FINDINGS: Normal size heart with aortic atherosclerosis. Subsegmental atelectasis and/or scarring at the left base. No pneumonic consolidation or pneumothorax. No pleural effusion. No acute osseous abnormality. Mild degenerative change along the dorsal spine. IMPRESSION: Left basilar atelectasis and/or scarring. No acute pulmonary disease. Aortic atherosclerosis. Electronically Signed   By: Tollie Ethavid  Kwon M.D.   On: 08/15/2017 21:44   ____________________________________________    PROCEDURES  Procedure(s) performed: None  Procedures  Critical Care performed: None  ____________________________________________   INITIAL IMPRESSION  / ASSESSMENT AND PLAN / ED COURSE  Pertinent labs & imaging results that were available during my care of the patient were reviewed by me and considered in my medical decision making (see chart for details).  Patient with very reproducible chest wall pain which happened when she was bending over awkwardly, right along the costochondral margin.  Very low suspicion for ACS PE or dissection etc. but we will obtain x-ray cardiac enzymes x2 just because of her age and history, if all that is negative suggested to the patient that she likely can go home  ----------------------------------------- 11:32 PM on 08/15/2017 -----------------------------------------  No acute distress, has only pain when I touch her chest wall, very reassuring workup, we discussed admission she would very much prefer to go home.  Second cardiac enzyme is negative, she will go home.  Signed out to Dr. Dolores FrameSung at the end of my shift.    ____________________________________________   FINAL CLINICAL IMPRESSION(S) / ED DIAGNOSES  Final diagnoses:  None      This chart was dictated using voice recognition software.  Despite best efforts to proofread,  errors can occur which can change meaning.      Jeanmarie PlantMcShane, James A, MD 08/15/17 2148    Jeanmarie PlantMcShane, James A, MD 08/15/17 [phone]

## 2017-08-16 DIAGNOSIS — R0789 Other chest pain: Secondary | ICD-10-CM | POA: Diagnosis not present

## 2017-08-16 LAB — TROPONIN I: Troponin I: <0.03 ng/mL (ref <0.03)

## 2017-08-16 NOTE — ED Notes (Signed)
This RN called Lab to check on status of Troponin. Lab tech states it will be another 30 mins till results are shown. RN will continue to monitor. Pt is aware.

## 2017-08-16 NOTE — ED Provider Notes (Signed)
-----------------------------------------   12:56 AM on 08/16/2017 -----------------------------------------  Repeat troponin remains unremarkable.  Will discharge home per previous provider's plan.  Strict return precautions given.  Patient and spouse verbalize understanding and agree with plan of care.   Caitlyn Stephenson, Jade J, MD 08/16/17 [phone]

## 2017-09-02 ENCOUNTER — Other Ambulatory Visit: Payer: Self-pay | Admitting: Orthopedic Surgery

## 2017-09-02 ENCOUNTER — Other Ambulatory Visit (HOSPITAL_COMMUNITY): Payer: Self-pay | Admitting: Orthopedic Surgery

## 2017-09-02 DIAGNOSIS — M2391 Unspecified internal derangement of right knee: Secondary | ICD-10-CM

## 2017-09-02 DIAGNOSIS — M1711 Unilateral primary osteoarthritis, right knee: Secondary | ICD-10-CM

## 2017-09-11 ENCOUNTER — Ambulatory Visit (HOSPITAL_COMMUNITY): Admission: RE | Admit: 2017-09-11 | Payer: Medicare Other | Source: Ambulatory Visit

## 2017-09-24 ENCOUNTER — Ambulatory Visit (HOSPITAL_COMMUNITY)
Admission: RE | Admit: 2017-09-24 | Discharge: 2017-09-24 | Disposition: A | Discharge status: Home / Self Care | Payer: Medicare Other | Source: Ambulatory Visit | Attending: Orthopedic Surgery | Admitting: Orthopedic Surgery

## 2017-09-24 DIAGNOSIS — M948X6 Other specified disorders of cartilage, lower leg: Secondary | ICD-10-CM | POA: Insufficient documentation

## 2017-09-24 DIAGNOSIS — M1711 Unilateral primary osteoarthritis, right knee: Secondary | ICD-10-CM | POA: Diagnosis not present

## 2017-09-24 DIAGNOSIS — M25461 Effusion, right knee: Secondary | ICD-10-CM | POA: Insufficient documentation

## 2017-09-24 DIAGNOSIS — S83241A Other tear of medial meniscus, current injury, right knee, initial encounter: Secondary | ICD-10-CM | POA: Insufficient documentation

## 2017-09-24 DIAGNOSIS — X58XXXA Exposure to other specified factors, initial encounter: Secondary | ICD-10-CM | POA: Insufficient documentation

## 2017-09-24 DIAGNOSIS — M2391 Unspecified internal derangement of right knee: Secondary | ICD-10-CM

## 2017-10-14 ENCOUNTER — Other Ambulatory Visit: Payer: Self-pay | Admitting: Internal Medicine

## 2017-10-14 DIAGNOSIS — Z1231 Encounter for screening mammogram for malignant neoplasm of breast: Secondary | ICD-10-CM

## 2017-11-06 ENCOUNTER — Ambulatory Visit
Admission: RE | Admit: 2017-11-06 | Discharge: 2017-11-06 | Disposition: A | Discharge status: Home / Self Care | Payer: Medicare Other | Source: Ambulatory Visit | Attending: Internal Medicine | Admitting: Internal Medicine

## 2017-11-06 DIAGNOSIS — Z1231 Encounter for screening mammogram for malignant neoplasm of breast: Secondary | ICD-10-CM | POA: Diagnosis present

## 2018-02-11 ENCOUNTER — Other Ambulatory Visit: Payer: Self-pay

## 2018-02-11 ENCOUNTER — Emergency Department: Payer: Medicare Other

## 2018-02-11 ENCOUNTER — Encounter: Payer: Self-pay | Admitting: Emergency Medicine

## 2018-02-11 ENCOUNTER — Emergency Department
Admission: EM | Admit: 2018-02-11 | Discharge: 2018-02-11 | Disposition: A | Discharge status: Home / Self Care | Payer: Medicare Other | Attending: Student in an Organized Health Care Education/Training Program | Admitting: Student in an Organized Health Care Education/Training Program

## 2018-02-11 DIAGNOSIS — I10 Essential (primary) hypertension: Secondary | ICD-10-CM | POA: Diagnosis not present

## 2018-02-11 DIAGNOSIS — Z79899 Other long term (current) drug therapy: Secondary | ICD-10-CM | POA: Diagnosis not present

## 2018-02-11 DIAGNOSIS — R410 Disorientation, unspecified: Secondary | ICD-10-CM | POA: Diagnosis not present

## 2018-02-11 DIAGNOSIS — R531 Weakness: Secondary | ICD-10-CM | POA: Diagnosis not present

## 2018-02-11 DIAGNOSIS — Z7984 Long term (current) use of oral hypoglycemic drugs: Secondary | ICD-10-CM | POA: Insufficient documentation

## 2018-02-11 DIAGNOSIS — E119 Type 2 diabetes mellitus without complications: Secondary | ICD-10-CM | POA: Diagnosis not present

## 2018-02-11 DIAGNOSIS — R61 Generalized hyperhidrosis: Secondary | ICD-10-CM | POA: Insufficient documentation

## 2018-02-11 DIAGNOSIS — Z950 Presence of cardiac pacemaker: Secondary | ICD-10-CM | POA: Diagnosis not present

## 2018-02-11 DIAGNOSIS — F0390 Unspecified dementia without behavioral disturbance: Secondary | ICD-10-CM | POA: Insufficient documentation

## 2018-02-11 DIAGNOSIS — F430 Acute stress reaction: Secondary | ICD-10-CM | POA: Insufficient documentation

## 2018-02-11 DIAGNOSIS — E039 Hypothyroidism, unspecified: Secondary | ICD-10-CM | POA: Diagnosis not present

## 2018-02-11 LAB — URINALYSIS, COMPLETE (UACMP) WITH MICROSCOPIC
BACTERIA UA: NONE SEEN
BILIRUBIN URINE: NEGATIVE
GLUCOSE, UA: NEGATIVE mg/dL
HGB URINE DIPSTICK: NEGATIVE
Ketones, ur: NEGATIVE mg/dL
LEUKOCYTES UA: NEGATIVE
NITRITE: NEGATIVE
PROTEIN: NEGATIVE mg/dL
Specific Gravity, Urine: 1.012 (ref 1.005–1.030)
pH: 5 (ref 5.0–8.0)

## 2018-02-11 LAB — HEPATIC FUNCTION PANEL
ALK PHOS: 54 U/L (ref 38–126)
ALT: 12 U/L (ref 0–44)
AST: 21 U/L (ref 15–41)
Albumin: 4.2 g/dL (ref 3.5–5.0)
BILIRUBIN DIRECT: 0.1 mg/dL (ref 0.0–0.2)
BILIRUBIN INDIRECT: 0.5 mg/dL (ref 0.3–0.9)
TOTAL PROTEIN: 7.5 g/dL (ref 6.5–8.1)
Total Bilirubin: 0.6 mg/dL (ref 0.3–1.2)

## 2018-02-11 LAB — CBC
HEMATOCRIT: 45.7 % (ref 35.0–47.0)
Hemoglobin: 15.2 g/dL (ref 12.0–16.0)
MCH: 30.6 pg (ref 26.0–34.0)
MCHC: 33.4 g/dL (ref 32.0–36.0)
MCV: 91.5 fL (ref 80.0–100.0)
Platelets: 317 10*3/uL (ref 150–440)
RBC: 4.99 MIL/uL (ref 3.80–5.20)
RDW: 13.2 % (ref 11.5–14.5)
WBC: 11.8 10*3/uL — AB (ref 3.6–11.0)

## 2018-02-11 LAB — BASIC METABOLIC PANEL
Anion gap: 12 (ref 5–15)
BUN: 21 mg/dL (ref 8–23)
CHLORIDE: 104 mmol/L (ref 98–111)
CO2: 22 mmol/L (ref 22–32)
Calcium: 9.2 mg/dL (ref 8.9–10.3)
Creatinine, Ser: 1.24 mg/dL — ABNORMAL HIGH (ref 0.44–1.00)
GFR calc Af Amer: 47 mL/min — ABNORMAL LOW (ref >60)
GFR, EST NON AFRICAN AMERICAN: 41 mL/min — AB (ref >60)
Glucose, Bld: 174 mg/dL — ABNORMAL HIGH (ref 70–99)
POTASSIUM: 3.7 mmol/L (ref 3.5–5.1)
SODIUM: 138 mmol/L (ref 135–145)

## 2018-02-11 LAB — TROPONIN I: Troponin I: <0.03 ng/mL (ref <0.03)

## 2018-02-11 MED ORDER — SODIUM CHLORIDE 0.9 % IV BOLUS
500.0000 mL | Freq: Once | INTRAVENOUS | Status: AC
  Administered 2018-02-11: 500 mL via INTRAVENOUS

## 2018-02-11 NOTE — ED Provider Notes (Signed)
[facility] Emergency Department Provider Note    First MD Initiated Contact with Patient 02/11/18 1540     (approximate)  I have reviewed the triage vital signs and the nursing notes.   HISTORY  Chief Complaint Weakness    HPI Caitlyn Stephenson is a 77 y.o. female with a history of sick sinus syndrome and bradycardia status post recent pacemaker placement presents the ER with chief complaint of feeling very sweaty and weak just afternoon while she was out shopping.  States that she started getting very sweaty confused and felt weak.  Denies any shortness of breath or chest pain during these episodes.  Did not feel any palpitations.  Did not feel hungry.  States she is been very stressed out trying to care for her elderly husband.   Done at bedside states that he has been noticing worsening dementia and states that she will have similar presentations to this related to anxiety.   Past Medical History:  Diagnosis Date  . Anemia   . Complication of anesthesia    difficult to wake up  . Diabetes mellitus without complication (HCC)   . Dysrhythmia 07/2017   SSS,bradycardia  . Edema   . GERD (gastroesophageal reflux disease)   . High cholesterol   . Hypertension   . Hypothyroidism   . Thyroid disease    Family History  Problem Relation Age of Onset  . Breast cancer Neg Hx    Past Surgical History:  Procedure Laterality Date  . ABDOMINAL HYSTERECTOMY  1983  . APPENDECTOMY    . BREAST BIOPSY Right 1965   cyst removed  . CHOLECYSTECTOMY    . PACEMAKER INSERTION Left 07/14/2017   Procedure: INSERTION PACEMAKER-DUAL CHAMBER;  Surgeon: Marcina Millard, MD;  Location: [facility] ORS;  Service: Cardiovascular;  Laterality: Left;  . THYROID SURGERY  1970   Patient Active Problem List   Diagnosis Date Noted  . Sick sinus syndrome (HCC) 07/14/2017      Prior to Admission medications   Medication Sig Start Date End Date Taking? Authorizing Provider    acetaminophen (TYLENOL) 500 MG tablet Take 500-1,000 mg by mouth every 8 (eight) hours as needed for moderate pain.    [provider]  aspirin EC 325 MG tablet Take 325 mg by mouth daily.    [provider]  Biotin 10 MG CAPS Take 10 mg by mouth daily.    [provider]  cephALEXin (KEFLEX) 250 MG capsule Take 2 capsules (500 mg total) by mouth 2 (two) times daily. 07/15/17   Paraschos, Alexander, MD  cholecalciferol (VITAMIN D) 400 units TABS tablet Take 400 Units by mouth daily.    [provider]  furosemide (LASIX) 80 MG tablet Take 80 mg by mouth daily as needed for edema.    [provider]  glimepiride (AMARYL) 1 MG tablet Take 0.5 mg by mouth daily with breakfast.    [provider]  lansoprazole (PREVACID) 15 MG capsule Take 15 mg by mouth daily at 12 noon.    [provider]  levothyroxine (SYNTHROID, LEVOTHROID) 100 MCG tablet Take 100 mcg by mouth daily before breakfast.    [provider]  lisinopril (PRINIVIL,ZESTRIL) 20 MG tablet Take 20 mg by mouth daily.    [provider]  lovastatin (MEVACOR) 20 MG tablet Take 40 mg by mouth at bedtime.    [provider]  OVER THE COUNTER MEDICATION Take 250 mg by mouth daily. Keratin otc supplement  [provider]    Allergies Fish allergy; Garlic; Other; Shellfish allergy; and Advil [ibuprofen]    Social History Social History   Tobacco Use  . Smoking status: Never Smoker  . Smokeless tobacco: Never Used  Substance Use Topics  . Alcohol use: No    Frequency: Never  . Drug use: No    Review of Systems Patient denies headaches, rhinorrhea, blurry vision, numbness, shortness of breath, chest pain, edema, cough, abdominal pain, nausea, vomiting, diarrhea, dysuria, fevers, rashes or hallucinations unless otherwise stated above in HPI. ____________________________________________   PHYSICAL EXAM:  VITAL SIGNS: Vitals:    02/11/18 1930 02/11/18 2000  BP: 140/70 129/64  Pulse: (!) 59 (!) 59  Resp: 14 15  Temp:    SpO2: 98% 97%    Constitutional: Alert and oriented.  Eyes: Conjunctivae are normal.  Head: Atraumatic. Nose: No congestion/rhinnorhea. Mouth/Throat: Mucous membranes are moist.   Neck: No stridor. Painless ROM.  Cardiovascular: Normal rate, regular rhythm. Grossly normal heart sounds.  Good peripheral circulation. Respiratory: Normal respiratory effort.  No retractions. Lungs CTAB. Gastrointestinal: Soft and nontender. No distention. No abdominal bruits. No CVA tenderness. Genitourinary:  Musculoskeletal: No lower extremity tenderness nor edema.  No joint effusions. Neurologic:  Normal speech and language. No gross focal neurologic deficits are appreciated. No facial droop Skin:  Skin is warm, dry and intact. No rash noted. Psychiatric: Mood and affect are normal. Speech and behavior are normal.  ____________________________________________   LABS (all labs ordered are listed, but only abnormal results are displayed)  Results for orders placed or performed during the hospital encounter of 02/11/18 (from the past 24 hour(s))  Basic metabolic panel     Status: Abnormal   Collection Time: 02/11/18  3:15 PM  Result Value Ref Range   Sodium 138 135 - 145 mmol/L   Potassium 3.7 3.5 - 5.1 mmol/L   Chloride 104 98 - 111 mmol/L   CO2 22 22 - 32 mmol/L   Glucose, Bld 174 (H) 70 - 99 mg/dL   BUN 21 8 - 23 mg/dL   Creatinine, Ser 1.611.24 (H) 0.44 - 1.00 mg/dL   Calcium 9.2 8.9 - 09.610.3 mg/dL   GFR calc non Af Amer 41 (L) >60 mL/min   GFR calc Af Amer 47 (L) >60 mL/min   Anion gap 12 5 - 15  CBC     Status: Abnormal   Collection Time: 02/11/18  3:15 PM  Result Value Ref Range   WBC 11.8 (H) 3.6 - 11.0 K/uL   RBC 4.99 3.80 - 5.20 MIL/uL   Hemoglobin 15.2 12.0 - 16.0 g/dL   HCT 04.545.7 40.935.0 - 81.147.0 %   MCV 91.5 80.0 - 100.0 fL   MCH 30.6 26.0 - 34.0 pg   MCHC 33.4 32.0 - 36.0 g/dL   RDW 91.413.2 78.211.5  - 95.614.5 %   Platelets 317 150 - 440 K/uL  Hepatic function panel     Status: None   Collection Time: 02/11/18  3:57 PM  Result Value Ref Range   Total Protein 7.5 6.5 - 8.1 g/dL   Albumin 4.2 3.5 - 5.0 g/dL   AST 21 15 - 41 U/L   ALT 12 0 - 44 U/L   Alkaline Phosphatase 54 38 - 126 U/L   Total Bilirubin 0.6 0.3 - 1.2 mg/dL   Bilirubin, Direct 0.1 0.0 - 0.2 mg/dL   Indirect Bilirubin 0.5 0.3 - 0.9 mg/dL  Troponin I     Status: None  Collection Time: 02/11/18  4:29 PM  Result Value Ref Range   Troponin I <0.03 <0.03 ng/mL  Urinalysis, Complete w Microscopic     Status: Abnormal   Collection Time: 02/11/18  6:30 PM  Result Value Ref Range   Color, Urine YELLOW (A) YELLOW   APPearance CLEAR (A) CLEAR   Specific Gravity, Urine 1.012 1.005 - 1.030   pH 5.0 5.0 - 8.0   Glucose, UA NEGATIVE NEGATIVE mg/dL   Hgb urine dipstick NEGATIVE NEGATIVE   Bilirubin Urine NEGATIVE NEGATIVE   Ketones, ur NEGATIVE NEGATIVE mg/dL   Protein, ur NEGATIVE NEGATIVE mg/dL   Nitrite NEGATIVE NEGATIVE   Leukocytes, UA NEGATIVE NEGATIVE   RBC / HPF 0-5 0 - 5 RBC/hpf   WBC, UA 0-5 0 - 5 WBC/hpf   Bacteria, UA NONE SEEN NONE SEEN   Squamous Epithelial / LPF 0-5 0 - 5   Mucus PRESENT    Hyaline Casts, UA PRESENT   Troponin I     Status: None   Collection Time: 02/11/18  6:30 PM  Result Value Ref Range   Troponin I <0.03 <0.03 ng/mL   ____________________________________________  EKG My review and personal interpretation at Time: 15:12   Indication: diaphoresis  Rate: 80  Rhythm: a-s v paced Axis: left Other: paced rhythm ____________________________________________  RADIOLOGY  I personally reviewed all radiographic images ordered to evaluate for the above acute complaints and reviewed radiology reports and findings.  These findings were personally discussed with the patient.  Please see medical record for radiology  report.  ____________________________________________   PROCEDURES  Procedure(s) performed:  Procedures    Critical Care performed: no ____________________________________________   INITIAL IMPRESSION / ASSESSMENT AND PLAN / ED COURSE  Pertinent labs & imaging results that were available during my care of the patient were reviewed by me and considered in my medical decision making (see chart for details).   DDX: ACS, dysrhythmia, dehydration, pneumonia, infectious process,  Charlett BlakeGlenda H Hubert is a 77 y.o. who presents to the ED with sx as described.  Patient is AFVSS in ED. Exam as above. Given current presentation have considered the above differential.  The patient will be placed on continuous pulse oximetry and telemetry for monitoring.  Laboratory evaluation will be sent to evaluate for the above complaints.     Clinical Course as of Feb 11 2334  Wed Feb 11, 2018  1707 Pacer interrogation is reassuring.  Patient updated results at bedside.  I strongly recommended the patient be followed by psychiatry as I do have a suspicion after discussing the case with her son that she is having severe anxiety and difficulty coping with stressors at home.  Patient does not want to speak with psychiatry.  States that she thinks that the episode this morning was after she got into an argument with her son about the stresses of downsizing their home and moving to an apartment which precipitated this episode.   [PR]    Clinical Course User Index [PR] Willy Eddyobinson, Florance Paolillo, MD     As part of my medical decision making, I reviewed the following data within the electronic MEDICAL RECORD NUMBER Nursing notes reviewed and incorporated, Labs reviewed, notes from prior ED visits.   ____________________________________________   FINAL CLINICAL IMPRESSION(S) / ED DIAGNOSES  Final diagnoses:  Generalized weakness  Stress reaction      NEW MEDICATIONS STARTED DURING THIS VISIT:  Discharge Medication  List as of 02/11/2018  8:08 PM       Note:  This document was prepared using Dragon voice recognition software and may include unintentional dictation errors.    Willy Eddy, MD 02/11/18 [phone]

## 2018-02-11 NOTE — ED Triage Notes (Signed)
Patient states that she felt weak this morning. Drove to lunch and while at lunch became cool and clammy and increasingly weak. Patient evaluated by EMS and called family to bring her to ED. Per son, patient had pacemaker placed in January by Dr. Cassie FreerParachos and has had multiple "issues" since then. Currently patient complaining of being lightheaded and sweating.

## 2018-02-11 NOTE — ED Notes (Signed)
Patient transported to X-ray 

## 2018-02-11 NOTE — ED Notes (Signed)
Pt ambulatory to toilet with standby assist. 

## 2018-02-11 NOTE — ED Notes (Signed)
Lab notified to add on Hepatic Function

## 2018-09-30 ENCOUNTER — Emergency Department: Payer: Medicare HMO

## 2018-09-30 ENCOUNTER — Other Ambulatory Visit: Payer: Self-pay

## 2018-09-30 ENCOUNTER — Emergency Department
Admission: EM | Admit: 2018-09-30 | Discharge: 2018-09-30 | Disposition: A | Discharge status: Home / Self Care | Payer: Medicare HMO | Attending: Emergency Medicine | Admitting: Emergency Medicine

## 2018-09-30 DIAGNOSIS — E119 Type 2 diabetes mellitus without complications: Secondary | ICD-10-CM | POA: Insufficient documentation

## 2018-09-30 DIAGNOSIS — I10 Essential (primary) hypertension: Secondary | ICD-10-CM | POA: Diagnosis not present

## 2018-09-30 DIAGNOSIS — Y9389 Activity, other specified: Secondary | ICD-10-CM | POA: Insufficient documentation

## 2018-09-30 DIAGNOSIS — Y929 Unspecified place or not applicable: Secondary | ICD-10-CM | POA: Diagnosis not present

## 2018-09-30 DIAGNOSIS — S4992XA Unspecified injury of left shoulder and upper arm, initial encounter: Secondary | ICD-10-CM | POA: Diagnosis present

## 2018-09-30 DIAGNOSIS — Y999 Unspecified external cause status: Secondary | ICD-10-CM | POA: Insufficient documentation

## 2018-09-30 DIAGNOSIS — S40022A Contusion of left upper arm, initial encounter: Secondary | ICD-10-CM

## 2018-09-30 DIAGNOSIS — W010XXA Fall on same level from slipping, tripping and stumbling without subsequent striking against object, initial encounter: Secondary | ICD-10-CM | POA: Insufficient documentation

## 2018-09-30 DIAGNOSIS — W19XXXA Unspecified fall, initial encounter: Secondary | ICD-10-CM

## 2018-09-30 DIAGNOSIS — Z950 Presence of cardiac pacemaker: Secondary | ICD-10-CM | POA: Diagnosis not present

## 2018-09-30 DIAGNOSIS — E039 Hypothyroidism, unspecified: Secondary | ICD-10-CM | POA: Insufficient documentation

## 2018-09-30 LAB — CBC WITH DIFFERENTIAL/PLATELET
Abs Immature Granulocytes: 0.02 10*3/uL (ref 0.00–0.07)
Basophils Absolute: 0.1 10*3/uL (ref 0.0–0.1)
Basophils Relative: 1 %
Eosinophils Absolute: 0.1 10*3/uL (ref 0.0–0.5)
Eosinophils Relative: 2 %
HCT: 43.4 % (ref 36.0–46.0)
Hemoglobin: 14 g/dL (ref 12.0–15.0)
Immature Granulocytes: 0 %
Lymphocytes Relative: 33 %
Lymphs Abs: 2.1 10*3/uL (ref 0.7–4.0)
MCH: 27 pg (ref 26.0–34.0)
MCHC: 32.3 g/dL (ref 30.0–36.0)
MCV: 83.6 fL (ref 80.0–100.0)
Monocytes Absolute: 0.8 10*3/uL (ref 0.1–1.0)
Monocytes Relative: 12 %
Neutro Abs: 3.3 10*3/uL (ref 1.7–7.7)
Neutrophils Relative %: 52 %
Platelets: 285 10*3/uL (ref 150–400)
RBC: 5.19 MIL/uL — ABNORMAL HIGH (ref 3.87–5.11)
RDW: 15.1 % (ref 11.5–15.5)
WBC: 6.4 10*3/uL (ref 4.0–10.5)
nRBC: 0 % (ref 0.0–0.2)

## 2018-09-30 LAB — COMPREHENSIVE METABOLIC PANEL
ALT: 12 U/L (ref 0–44)
AST: 23 U/L (ref 15–41)
Albumin: 3.9 g/dL (ref 3.5–5.0)
Alkaline Phosphatase: 67 U/L (ref 38–126)
Anion gap: 9 (ref 5–15)
BUN: 13 mg/dL (ref 8–23)
CO2: 22 mmol/L (ref 22–32)
Calcium: 8.6 mg/dL — ABNORMAL LOW (ref 8.9–10.3)
Chloride: 106 mmol/L (ref 98–111)
Creatinine, Ser: 0.98 mg/dL (ref 0.44–1.00)
GFR calc Af Amer: >60 mL/min (ref >60)
GFR calc non Af Amer: 55 mL/min — ABNORMAL LOW (ref >60)
Glucose, Bld: 165 mg/dL — ABNORMAL HIGH (ref 70–99)
Potassium: 4.3 mmol/L (ref 3.5–5.1)
Sodium: 137 mmol/L (ref 135–145)
Total Bilirubin: 0.4 mg/dL (ref 0.3–1.2)
Total Protein: 7.3 g/dL (ref 6.5–8.1)

## 2018-09-30 LAB — URINALYSIS, COMPLETE (UACMP) WITH MICROSCOPIC
Bacteria, UA: NONE SEEN
Bilirubin Urine: NEGATIVE
Glucose, UA: NEGATIVE mg/dL
Hgb urine dipstick: NEGATIVE
Ketones, ur: NEGATIVE mg/dL
Leukocytes,Ua: NEGATIVE
Nitrite: NEGATIVE
Protein, ur: NEGATIVE mg/dL
Specific Gravity, Urine: 1.015 (ref 1.005–1.030)
pH: 5 (ref 5.0–8.0)

## 2018-09-30 MED ORDER — TRAMADOL HCL 50 MG PO TABS
50.0000 mg | ORAL_TABLET | Freq: Once | ORAL | Status: AC
  Administered 2018-09-30: 50 mg via ORAL
  Filled 2018-09-30: qty 1

## 2018-09-30 NOTE — ED Notes (Signed)
Pt's daughter in law Drinda Butts, to ED, visitor policy explained, phone number [phone].

## 2018-09-30 NOTE — ED Notes (Signed)
Pt to Xray with technician.

## 2018-09-30 NOTE — ED Provider Notes (Signed)
[facility] Emergency Department Provider Note       Time seen: ----------------------------------------- 1:45 PM on 09/30/2018 -----------------------------------------   I have reviewed the triage vital signs and the nursing notes.  HISTORY   Chief Complaint Fall    HPI Caitlyn Stephenson is a 78 y.o. female with a history of anemia, diabetes, edema, hyperlipidemia, hypertension who presents to the ED for slip and fall.  Patient was trying to get out of the chair in her socks slipped on a hardwood floor.  She fell on her left side.  Upon EMS arrival while she was trying to stand up she lost consciousness.  She reportedly has a history of hypotension and a pacemaker.  She arrives alert and oriented only complaining of left arm pain at this time.  Past Medical History:  Diagnosis Date  . Anemia   . Complication of anesthesia    difficult to wake up  . Diabetes mellitus without complication (HCC)   . Dysrhythmia 07/2017   SSS,bradycardia  . Edema   . GERD (gastroesophageal reflux disease)   . High cholesterol   . Hypertension   . Hypothyroidism   . Thyroid disease     Patient Active Problem List   Diagnosis Date Noted  . Sick sinus syndrome (HCC) 07/14/2017    Past Surgical History:  Procedure Laterality Date  . ABDOMINAL HYSTERECTOMY  1983  . APPENDECTOMY    . BREAST BIOPSY Right 1965   cyst removed  . CHOLECYSTECTOMY    . PACEMAKER INSERTION Left 07/14/2017   Procedure: INSERTION PACEMAKER-DUAL CHAMBER;  Surgeon: Marcina Millard, MD;  Location: [facility] ORS;  Service: Cardiovascular;  Laterality: Left;  . THYROID SURGERY  1970    Allergies Fish allergy; Garlic; Other; Shellfish allergy; and Advil [ibuprofen]  Social History Social History   Tobacco Use  . Smoking status: Never Smoker  . Smokeless tobacco: Never Used  Substance Use Topics  . Alcohol use: No    Frequency: Never  . Drug use: No   Review of Systems Constitutional:  Negative for fever. Cardiovascular: Negative for chest pain. Respiratory: Negative for shortness of breath. Gastrointestinal: Negative for abdominal pain, vomiting and diarrhea. Musculoskeletal: Positive for left arm pain Skin: Negative for rash. Neurological: Negative for headaches, focal weakness or numbness.  All systems negative/normal/unremarkable except as stated in the HPI  ____________________________________________   PHYSICAL EXAM:  VITAL SIGNS: ED Triage Vitals [09/30/18 1345]  Enc Vitals Group     BP      Pulse Rate 64     Resp 15     Temp 98.4 F (36.9 C)     Temp Source Oral     SpO2 99 %     Weight      Height      Head Circumference      Peak Flow      Pain Score      Pain Loc      Pain Edu?      Excl. in GC?    Constitutional: Alert and oriented. Well appearing and in no distress. Eyes: Conjunctivae are normal. Normal extraocular movements. ENT      Head: Normocephalic and atraumatic.      Nose: No congestion/rhinnorhea.      Mouth/Throat: Mucous membranes are moist.      Neck: No stridor. Cardiovascular: Normal rate, regular rhythm. No murmurs, rubs, or gallops. Respiratory: Normal respiratory effort without tachypnea nor retractions. Breath sounds are clear and equal bilaterally. No wheezes/rales/rhonchi. Gastrointestinal:  Soft and nontender. Normal bowel sounds Musculoskeletal: Left humerus tenderness, no obvious swelling is noted. Neurologic:  Normal speech and language. No gross focal neurologic deficits are appreciated.  Skin:  Skin is warm, dry and intact. No rash noted. Psychiatric: Mood and affect are normal. Speech and behavior are normal.  ____________________________________________  EKG: Interpreted by me.  AV dual paced rhythm with a rate of 60 bpm, normal pacemaker function  ____________________________________________  ED COURSE:  As part of my medical decision making, I reviewed the following data within the electronic medical  record:  History obtained from family if available, nursing notes, old chart and ekg, as well as notes from prior ED visits. Patient presented for slip and fall with possible syncope, we will assess with labs and imaging as indicated at this time.   Procedures Caitlyn Stephenson was evaluated in Emergency Department on 09/30/2018 for the symptoms described in the history of present illness. She was evaluated in the context of the global COVID-19 pandemic, which necessitated consideration that the patient might be at risk for infection with the SARS-CoV-2 virus that causes COVID-19. Institutional protocols and algorithms that pertain to the evaluation of patients at risk for COVID-19 are in a state of rapid change based on information released by regulatory bodies including the CDC and federal and state organizations. These policies and algorithms were followed during the patient's care in the ED.  ____________________________________________   LABS (pertinent positives/negatives)  Labs Reviewed  CBC WITH DIFFERENTIAL/PLATELET - Abnormal; Notable for the following components:      Result Value   RBC 5.19 (*)    All other components within normal limits  COMPREHENSIVE METABOLIC PANEL - Abnormal; Notable for the following components:   Glucose, Bld 165 (*)    Calcium 8.6 (*)    GFR calc non Af Amer 55 (*)    All other components within normal limits  URINALYSIS, COMPLETE (UACMP) WITH MICROSCOPIC - Abnormal; Notable for the following components:   Color, Urine YELLOW (*)    APPearance CLEAR (*)    All other components within normal limits    RADIOLOGY Images were viewed by me  Chest x-ray, left humerus x-ray Not reveal any acute process ____________________________________________   DIFFERENTIAL DIAGNOSIS   Fall, contusion, fracture, dehydration, electrolyte abnormality, arrhythmia  FINAL ASSESSMENT AND PLAN  Fall, left arm contusion  Plan: The patient had presented for fall with  possible syncope. Patient's labs were reassuring. Patient's imaging not reveal any acute process.  Her pacemaker was interrogated without any acute abnormality identified.  She is cleared for outpatient follow-up.   Ulice Dash, MD    Note: This note was generated in part or whole with voice recognition software. Voice recognition is usually quite accurate but there are transcription errors that can and very often do occur. I apologize for any typographical errors that were not detected and corrected.     Emily Filbert, MD 09/30/18 [phone]

## 2018-09-30 NOTE — ED Notes (Signed)
Pt is being discharged to home. Aox4, VSS, pt does c/o left arm pain at this time. AVS was given and explained to the pt and she verbalized understanding of all information.

## 2018-09-30 NOTE — ED Notes (Signed)
[employer] representative contacted this RN and gave a verbal report stating that the pacer has not shown any abnormal beats and or hardware malfunctioning.  He also reports that the hard copy of the report is being faxed to our ED.

## 2018-09-30 NOTE — ED Triage Notes (Signed)
PT to ED via EMS from home. Pt was trying to get out of chair and socks slipped on hardword floor and pt fell on left side. Upon EMS arrival while trying to stand pt up, pt lost consciousness. PT hx of hypotension and pacemaker. PT AO at this time.

## 2021-02-06 ENCOUNTER — Ambulatory Visit
Admission: RE | Admit: 2021-02-06 | Discharge: 2021-02-06 | Disposition: A | Discharge status: Home / Self Care | Payer: Medicare HMO | Source: Ambulatory Visit | Attending: Internal Medicine | Admitting: Internal Medicine

## 2021-02-06 ENCOUNTER — Other Ambulatory Visit: Payer: Self-pay

## 2021-02-06 ENCOUNTER — Other Ambulatory Visit: Payer: Self-pay | Admitting: Internal Medicine

## 2021-02-06 DIAGNOSIS — M79605 Pain in left leg: Secondary | ICD-10-CM

## 2021-02-06 DIAGNOSIS — R600 Localized edema: Secondary | ICD-10-CM

## 2021-02-09 ENCOUNTER — Ambulatory Visit
Admission: RE | Admit: 2021-02-09 | Discharge: 2021-02-09 | Disposition: A | Discharge status: Home / Self Care | Payer: Medicare HMO | Source: Ambulatory Visit | Attending: Internal Medicine | Admitting: Internal Medicine

## 2021-02-09 ENCOUNTER — Other Ambulatory Visit: Payer: Self-pay

## 2021-02-09 DIAGNOSIS — M79605 Pain in left leg: Secondary | ICD-10-CM | POA: Insufficient documentation

## 2021-02-09 DIAGNOSIS — R600 Localized edema: Secondary | ICD-10-CM | POA: Insufficient documentation

## 2021-03-12 HISTORY — PX: JOINT REPLACEMENT: SHX530

## 2021-08-22 ENCOUNTER — Other Ambulatory Visit: Payer: Self-pay | Admitting: Internal Medicine

## 2021-08-22 ENCOUNTER — Other Ambulatory Visit (HOSPITAL_COMMUNITY): Payer: Self-pay | Admitting: Internal Medicine

## 2021-08-22 DIAGNOSIS — R413 Other amnesia: Secondary | ICD-10-CM

## 2021-08-23 DIAGNOSIS — E538 Deficiency of other specified B group vitamins: Secondary | ICD-10-CM | POA: Insufficient documentation

## 2021-09-05 ENCOUNTER — Ambulatory Visit: Payer: Medicare HMO

## 2021-10-01 ENCOUNTER — Other Ambulatory Visit: Payer: Self-pay | Admitting: Neurology

## 2021-10-01 ENCOUNTER — Other Ambulatory Visit (HOSPITAL_COMMUNITY): Payer: Self-pay | Admitting: Neurology

## 2021-10-01 DIAGNOSIS — R413 Other amnesia: Secondary | ICD-10-CM

## 2021-11-12 ENCOUNTER — Ambulatory Visit (HOSPITAL_COMMUNITY)
Admission: RE | Admit: 2021-11-12 | Discharge: 2021-11-12 | Disposition: A | Discharge status: Home / Self Care | Payer: Medicare HMO | Source: Ambulatory Visit | Attending: Neurology | Admitting: Neurology

## 2021-11-12 DIAGNOSIS — R413 Other amnesia: Secondary | ICD-10-CM | POA: Insufficient documentation

## 2021-11-12 NOTE — Progress Notes (Signed)
Per order, Changed device settings to DOO 85 MRI safe mode, tachy therapies off if applicable.  ? ?Will program back to regular settings after scan and send transmission. ?

## 2022-01-06 NOTE — Discharge Instructions (Signed)
Instructions after Total Knee Replacement   Jenalee Trevizo P. Duy Lemming, Jr., M.D.     Dept. of Orthopaedics & Sports Medicine  [facility]  [address]  [place], [place]  [address]  Phone: [phone]   Fax: [phone]    DIET: Drink plenty of non-alcoholic fluids. Resume your normal diet. Include foods high in fiber.  ACTIVITY:  You may use crutches or a walker with weight-bearing as tolerated, unless instructed otherwise. You may be weaned off of the walker or crutches by your Physical Therapist.  Do NOT place pillows under the knee. Anything placed under the knee could limit your ability to straighten the knee.   Continue doing gentle exercises. Exercising will reduce the pain and swelling, increase motion, and prevent muscle weakness.   Please continue to use the TED compression stockings for 6 weeks. You may remove the stockings at night, but should reapply them in the morning. Do not drive or operate any equipment until instructed.  WOUND CARE:  Continue to use the PolarCare or ice packs periodically to reduce pain and swelling. You may bathe or shower after the staples are removed at the first office visit following surgery.  MEDICATIONS: You may resume your regular medications. Please take the pain medication as prescribed on the medication. Do not take pain medication on an empty stomach. You have been given a prescription for a blood thinner (Lovenox or Coumadin). Please take the medication as instructed. (NOTE: After completing a 2 week course of Lovenox, take one Enteric-coated aspirin once a day. This along with elevation will help reduce the possibility of phlebitis in your operated leg.) Do not drive or drink alcoholic beverages when taking pain medications.  CALL THE OFFICE FOR: Temperature above 101 degrees Excessive bleeding or drainage on the dressing. Excessive swelling, coldness, or paleness of the toes. Persistent nausea and vomiting.  FOLLOW-UP:  You  should have an appointment to return to the office in 10-14 days after surgery. Arrangements have been made for continuation of Physical Therapy (either home therapy or outpatient therapy).   [facility] Department Directory         [email or web address]       [email or web address]          Cardiology  Appointments: [place] - [phone] [place] - [phone]  Endocrinology  Appointments: [place] - [phone] [place] - [phone]  Gastroenterology  Appointments: [place] - [phone] [place] - [phone]        General Surgery   Appointments: [place] - [phone]  Internal Medicine/Family Medicine  Appointments: [place] - [phone] [place] - [phone] [place] - [phone]  Metabolic and Weigh Loss Surgery  Appointments: [place] - [phone]        Neurology  Appointments: [place] - [phone] [place] - [phone]  Neurosurgery  Appointments: [place] - [phone]  Obstetrics & Gynecology  Appointments: [place] - [phone] [place] - [phone]        Pediatrics  Appointments: [place] - [phone] [place] - [phone]  Physiatry  Appointments: [place] -[phone]  Physical Therapy  Appointments: [place] - [phone] [place] - [phone]        Podiatry  Appointments: [place] - [phone] [place] - [phone]  Pulmonology  Appointments: [place] - [phone]  Rheumatology  Appointments: [place] - [phone]        [place] Location: [facility]  [address] [place], [place]  [address]  [place] Location: [facility] [address] [place], [place]  [address]  [place] Location: [facility] [address] [place], [place]  [address]    

## 2022-01-07 ENCOUNTER — Inpatient Hospital Stay
Admission: RE | Admit: 2022-01-07 | Discharge: 2022-01-07 | Disposition: A | Discharge status: Home / Self Care | Payer: Medicare HMO | Source: Ambulatory Visit

## 2022-01-07 HISTORY — DX: Spinal stenosis, lumbar region without neurogenic claudication: M48.061

## 2022-01-07 HISTORY — DX: Unspecified asthma, uncomplicated: J45.909

## 2022-01-07 HISTORY — DX: Sick sinus syndrome: I49.5

## 2022-01-07 HISTORY — DX: Left bundle-branch block, unspecified: I44.7

## 2022-01-07 HISTORY — DX: Presence of cardiac pacemaker: Z95.0

## 2022-01-07 HISTORY — DX: Unspecified osteoarthritis, unspecified site: M19.90

## 2022-01-07 HISTORY — DX: Bradycardia, unspecified: R00.1

## 2022-01-07 HISTORY — DX: Chronic kidney disease, stage 3 unspecified: N18.30

## 2022-01-07 HISTORY — DX: Angioneurotic edema, initial encounter: T78.3XXA

## 2022-01-07 NOTE — Patient Instructions (Signed)
Your procedure is scheduled on:01-18-22 Friday Report to the Registration Desk on the 1st floor of the [facility].Then proceed to the 2nd floor Surgery Desk To find out your arrival time, please call [phone] between 1PM - 3PM on:01-17-22 Thursday If your arrival time is 6:00 am, do not arrive prior to that time as the [facility] entrance doors do not open until 6:00 am.  REMEMBER: Instructions that are not followed completely may result in serious medical risk, up to and including death; or upon the discretion of your surgeon and anesthesiologist your surgery may need to be rescheduled.  Do not eat food after midnight the night before surgery.  No gum chewing, lozengers or hard candies.  You may however, drink Water up to 2 hours before you are scheduled to arrive for your surgery. Do not drink anything within 2 hours of your scheduled arrival time.  Type 1 and Type 2 diabetics should only drink water.  In addition, your doctor has ordered for you to drink the provided  Gatorade G2 Drinking this carbohydrate drink up to two hours before surgery helps to reduce insulin resistance and improve patient outcomes. Please complete drinking 2 hours prior to scheduled arrival time.  TAKE THESE MEDICATIONS THE MORNING OF SURGERY WITH A SIP OF WATER: -busPIRone (BUSPAR) -donepezil (ARICEPT) -escitalopram (LEXAPRO) -levothyroxine (SYNTHROID)  One week prior to surgery: Stop Anti-inflammatories (NSAIDS) such as Advil, Aleve, Ibuprofen, Motrin, Naproxen, Naprosyn and Aspirin based products such as Excedrin, Goodys Powder, BC Powder.You may however, take Tylenol if needed for pain up until the day of surgery.  Stop ANY OVER THE COUNTER supplements/vitamins 7 days prior to surgery   No Alcohol for 24 hours before or after surgery.  No Smoking including e-cigarettes for 24 hours prior to surgery.  No chewable tobacco products for at least 6 hours prior to surgery.  No nicotine patches on  the day of surgery.  Do not use any "recreational" drugs for at least a week prior to your surgery.  Please be advised that the combination of cocaine and anesthesia may have negative outcomes, up to and including death. If you test positive for cocaine, your surgery will be cancelled.  On the morning of surgery brush your teeth with toothpaste and water, you may rinse your mouth with mouthwash if you wish. Do not swallow any toothpaste or mouthwash.  Use CHG Soap as directed on instruction sheet.  Do not wear jewelry, make-up, hairpins, clips or nail polish.  Do not wear lotions, powders, or perfumes.   Do not shave body from the neck down 48 hours prior to surgery just in case you cut yourself which could leave a site for infection.  Also, freshly shaved skin may become irritated if using the CHG soap.  Contact lenses, hearing aids and dentures may not be worn into surgery.  Do not bring valuables to the hospital. [facility] is not responsible for any missing/lost belongings or valuables.   Notify your doctor if there is any change in your medical condition (cold, fever, infection).  Wear comfortable clothing (specific to your surgery type) to the hospital.  After surgery, you can help prevent lung complications by doing breathing exercises.  Take deep breaths and cough every 1-2 hours. Your doctor may order a device called an Incentive Spirometer to help you take deep breaths. When coughing or sneezing, hold a pillow firmly against your incision with both hands. This is called "splinting." Doing this helps protect your incision. It also  decreases belly discomfort.  If you are being admitted to the hospital overnight, leave your suitcase in the car. After surgery it may be brought to your room.  If you are being discharged the day of surgery, you will not be allowed to drive home. You will need a responsible adult (18 years or older) to drive you home and stay with you that  night.   If you are taking public transportation, you will need to have a responsible adult (18 years or older) with you. Please confirm with your physician that it is acceptable to use public transportation.   Please call the Pre-admissions Testing Dept. at [phone] if you have any questions about these instructions.  Surgery Visitation Policy:  Patients undergoing a surgery or procedure may have two family members or support persons with them as long as the person is not COVID-19 positive or experiencing its symptoms.   Inpatient Visitation:    Visiting hours are 7 a.m. to 8 p.m. Up to four visitors are allowed at one time in a patient room, including children. The visitors may rotate out with other people during the day. One designated support person (adult) may remain overnight.   How to Use Chlorhexidine for Bathing Chlorhexidine gluconate (CHG) is a germ-killing (antiseptic) solution that is used to clean the skin. It can get rid of the bacteria that normally live on the skin and can keep them away for about 24 hours. To clean your skin with CHG, you may be given: A CHG solution to use in the shower or as part of a sponge bath. A prepackaged cloth that contains CHG. Cleaning your skin with CHG may help lower the risk for infection: While you are staying in the intensive care unit of the hospital. If you have a vascular access, such as a central line, to provide short-term or long-term access to your veins. If you have a catheter to drain urine from your bladder. If you are on a ventilator. A ventilator is a machine that helps you breathe by moving air in and out of your lungs. After surgery. What are the risks? Risks of using CHG include: A skin reaction. Hearing loss, if CHG gets in your ears and you have a perforated eardrum. Eye injury, if CHG gets in your eyes and is not rinsed out. The CHG product catching fire. Make sure that you avoid smoking and flames after  applying CHG to your skin. Do not use CHG: If you have a chlorhexidine allergy or have previously reacted to chlorhexidine. On babies younger than 88 months of age. How to use CHG solution Use CHG only as told by your health care provider, and follow the instructions on the label. Use the full amount of CHG as directed. Usually, this is one bottle. During a shower Follow these steps when using CHG solution during a shower (unless your health care provider gives you different instructions): Start the shower. Use your normal soap and shampoo to wash your face and hair. Turn off the shower or move out of the shower stream. Pour the CHG onto a clean washcloth. Do not use any type of brush or rough-edged sponge. Starting at your neck, lather your body down to your toes. Make sure you follow these instructions: If you will be having surgery, pay special attention to the part of your body where you will be having surgery. Scrub this area for at least 1 minute. Do not use CHG on your head or face. If  the solution gets into your ears or eyes, rinse them well with water. Avoid your genital area. Avoid any areas of skin that have broken skin, cuts, or scrapes. Scrub your back and under your arms. Make sure to wash skin folds. Let the lather sit on your skin for 1-2 minutes or as long as told by your health care provider. Thoroughly rinse your entire body in the shower. Make sure that all body creases and crevices are rinsed well. Dry off with a clean towel. Do not put any substances on your body afterward--such as powder, lotion, or perfume--unless you are told to do so by your health care provider. Only use lotions that are recommended by the manufacturer. Put on clean clothes or pajamas. -If it is the night before your surgery, sleep in clean sheets  Contact a health care provider if: Your skin gets irritated after scrubbing. You have questions about using your solution or cloth. You swallow any  chlorhexidine. Call your local poison control center ([phone] in the [place] Get help right away if: Your eyes itch badly, or they become very red or swollen. Your skin itches badly and is red or swollen. Your hearing changes. You have trouble seeing. You have swelling or tingling in your mouth or throat. You have trouble breathing. These symptoms may represent a serious problem that is an emergency. Do not wait to see if the symptoms will go away. Get medical help right away. Call your local emergency services (911 in the [place] Do not drive yourself to the hospital. Summary Chlorhexidine gluconate (CHG) is a germ-killing (antiseptic) solution that is used to clean the skin. Cleaning your skin with CHG may help to lower your risk for infection. You may be given CHG to use for bathing. It may be in a bottle or in a prepackaged cloth to use on your skin. Carefully follow your health care provider's instructions and the instructions on the product label. Do not use CHG if you have a chlorhexidine allergy. Contact your health care provider if your skin gets irritated after scrubbing. This information is not intended to replace advice given to you by your health care provider. Make sure you discuss any questions you have with your health care provider. Document Revised: 08/28/2020 Document Reviewed: 08/28/2020 Elsevier Patient Education  2023 [employer].  How to Use an Incentive Spirometer An incentive spirometer is a tool that measures how well you are filling your lungs with each breath. Learning to take long, deep breaths using this tool can help you keep your lungs clear and active. This may help to reverse or lessen your chance of developing breathing (pulmonary) problems, especially infection. You may be asked to use a spirometer: After a surgery. If you have a lung problem or a history of smoking. After a long period of time when you have been unable to move or be active. If the  spirometer includes an indicator to show the highest number that you have reached, your health care provider or respiratory therapist will help you set a goal. Keep a log of your progress as told by your health care provider. What are the risks? Breathing too quickly may cause dizziness or cause you to pass out. Take your time so you do not get dizzy or light-headed. If you are in pain, you may need to take pain medicine before doing incentive spirometry. It is harder to take a deep breath if you are having pain. How to use your incentive spirometer  Sit  up on the edge of your bed or on a chair. Hold the incentive spirometer so that it is in an upright position. Before you use the spirometer, breathe out normally. Place the mouthpiece in your mouth. Make sure your lips are closed tightly around it. Breathe in slowly and as deeply as you can through your mouth, causing the piston or the ball to rise toward the top of the chamber. Hold your breath for 3-5 seconds, or for as long as possible. If the spirometer includes a coach indicator, use this to guide you in breathing. Slow down your breathing if the indicator goes above the marked areas. Remove the mouthpiece from your mouth and breathe out normally. The piston or ball will return to the bottom of the chamber. Rest for a few seconds, then repeat the steps 10 or more times. Take your time and take a few normal breaths between deep breaths so that you do not get dizzy or light-headed. Do this every 1-2 hours when you are awake. If the spirometer includes a goal marker to show the highest number you have reached (best effort), use this as a goal to work toward during each repetition. After each set of 10 deep breaths, cough a few times. This will help to make sure that your lungs are clear. If you have an incision on your chest or abdomen from surgery, place a pillow or a rolled-up towel firmly against the incision when you cough. This can help to  reduce pain while taking deep breaths and coughing. General tips When you are able to get out of bed: Walk around often. Continue to take deep breaths and cough in order to clear your lungs. Keep using the incentive spirometer until your health care provider says it is okay to stop using it. If you have been in the hospital, you may be told to keep using the spirometer at home. Contact a health care provider if: You are having difficulty using the spirometer. You have trouble using the spirometer as often as instructed. Your pain medicine is not giving enough relief for you to use the spirometer as told. You have a fever. Get help right away if: You develop shortness of breath. You develop a cough with bloody mucus from the lungs. You have fluid or blood coming from an incision site after you cough. Summary An incentive spirometer is a tool that can help you learn to take long, deep breaths to keep your lungs clear and active. You may be asked to use a spirometer after a surgery, if you have a lung problem or a history of smoking, or if you have been inactive for a long period of time. Use your incentive spirometer as instructed every 1-2 hours while you are awake. If you have an incision on your chest or abdomen, place a pillow or a rolled-up towel firmly against your incision when you cough. This will help to reduce pain. Get help right away if you have shortness of breath, you cough up bloody mucus, or blood comes from your incision when you cough. This information is not intended to replace advice given to you by your health care provider. Make sure you discuss any questions you have with your health care provider. Document Revised: 09/06/2019 Document Reviewed: 09/06/2019 Elsevier Patient Education  2023 [employer].

## 2022-01-10 ENCOUNTER — Encounter
Admission: RE | Admit: 2022-01-10 | Discharge: 2022-01-10 | Disposition: A | Discharge status: Home / Self Care | Payer: Medicare HMO | Source: Ambulatory Visit | Attending: Orthopedic Surgery | Admitting: Orthopedic Surgery

## 2022-01-10 DIAGNOSIS — Z01818 Encounter for other preprocedural examination: Secondary | ICD-10-CM | POA: Insufficient documentation

## 2022-01-10 DIAGNOSIS — E1122 Type 2 diabetes mellitus with diabetic chronic kidney disease: Secondary | ICD-10-CM | POA: Insufficient documentation

## 2022-01-10 DIAGNOSIS — M1711 Unilateral primary osteoarthritis, right knee: Secondary | ICD-10-CM | POA: Insufficient documentation

## 2022-01-10 DIAGNOSIS — I495 Sick sinus syndrome: Secondary | ICD-10-CM | POA: Insufficient documentation

## 2022-01-10 DIAGNOSIS — N183 Chronic kidney disease, stage 3 unspecified: Secondary | ICD-10-CM | POA: Insufficient documentation

## 2022-01-10 DIAGNOSIS — Z01812 Encounter for preprocedural laboratory examination: Secondary | ICD-10-CM

## 2022-01-10 LAB — URINALYSIS, ROUTINE W REFLEX MICROSCOPIC
Bilirubin Urine: NEGATIVE
Glucose, UA: NEGATIVE mg/dL
Hgb urine dipstick: NEGATIVE
Ketones, ur: NEGATIVE mg/dL
Leukocytes,Ua: NEGATIVE
Nitrite: NEGATIVE
Protein, ur: NEGATIVE mg/dL
Specific Gravity, Urine: 1.012 (ref 1.005–1.030)
pH: 5 (ref 5.0–8.0)

## 2022-01-10 LAB — SURGICAL PCR SCREEN
MRSA, PCR: NEGATIVE
Staphylococcus aureus: NEGATIVE

## 2022-01-10 LAB — C-REACTIVE PROTEIN: CRP: 0.8 mg/dL (ref <1.0)

## 2022-01-10 LAB — SEDIMENTATION RATE: Sed Rate: 7 mm/hr (ref 0–30)

## 2022-01-10 LAB — TYPE AND SCREEN
ABO/RH(D): B NEG
Antibody Screen: NEGATIVE

## 2022-01-10 NOTE — Patient Instructions (Addendum)
Your procedure is scheduled on: Monday January 21, 2022. Report to [facility] Surgery inside [facility] 2nd floor, stop by admissions desk before getting on elevator.  To find out your arrival time please call [phone] between 1PM - 3PM on Friday January 18, 2022.  Remember: Instructions that are not followed completely may result in serious medical risk,  up to and including death, or upon the discretion of your surgeon and anesthesiologist your  surgery may need to be rescheduled.     _X__ 1. Do not eat food after midnight the night before your procedure.                 No chewing gum or hard candies. You may drink clear liquids up to 2 hours                 before you are scheduled to arrive for your surgery- DO not drink clear                 liquids within 2 hours of the start of your surgery.                 Clear Liquids include:  water, apple juice without pulp, clear Gatorade, G2 or                  Gatorade Zero (avoid Red/Purple/Blue), Black Coffee or Tea (Do not add                 anything to coffee or tea).  __X__2.   Complete the "Ensure Clear Pre-surgery Clear Carbohydrate Drink" provided to you, 2 hours before arrival. **If you are diabetic you will be provided with an alternative drink, Gatorade Zero or G2.  __X__3.  On the morning of surgery brush your teeth with toothpaste and water, you                may rinse your mouth with mouthwash if you wish.  Do not swallow any toothpaste of mouthwash.     _X__ 4.  No Alcohol for 24 hours before or after surgery.   _X__ 5.  Do Not Smoke or use e-cigarettes For 24 Hours Prior to Your Surgery.                 Do not use any chewable tobacco products for at least 6 hours prior to                 Surgery.  _X__  6.  Do not use any recreational drugs (marijuana, cocaine, heroin, ecstasy, MDMA or other)                For at least one week prior to your surgery.  Combination of these drugs with anesthesia                 May have life threatening results.  ____  7.  Bring all medications with you on the day of surgery if instructed.   __X_ 8.  Notify your doctor if there is any change in your medical condition      (cold, fever, infections).     Do not wear jewelry, make-up, hairpins, clips or nail polish. Do not wear lotions, powders, or perfumes. You may wear deodorant. Do not shave 48 hours prior to surgery. Men may shave face and neck. Do not bring valuables to the hospital.    [facility] is not responsible for any belongings or valuables.  Contacts, dentures or bridgework may not be worn into surgery. Leave your suitcase in the car. After surgery it may be brought to your room. For patients admitted to the hospital, discharge time is determined by your treatment team.   Patients discharged the day of surgery will not be allowed to drive home.   Make arrangements for someone to be with you for the first 24 hours of your Same Day Discharge.   __X__ Take these medicines the morning of surgery with A SIP OF WATER:    1. busPIRone (BUSPAR) 7.5 MG  2. donepezil (ARICEPT) 5 MG  3. escitalopram (LEXAPRO) 10 MG   4. levothyroxine (SYNTHROID) 88 MCG   5.   6.  ____ Fleet Enema (as directed)   __X__ Use CHG Soap (or wipes) as directed  ____ Use Benzoyl Peroxide Gel as instructed  ____ Use inhalers on the day of surgery  ____ Stop metformin 2 days prior to surgery    ____ Take 1/2 of usual insulin dose the night before surgery. No insulin the morning          of surgery.   __X__ Check with your doctor tomorrow's appointment as to when to stop aspirin 81 mg stop before your surgery.   __X__ One Week prior to surgery- Stop Anti-inflammatories such as Ibuprofen, Aleve, Advil, Motrin, meloxicam (MOBIC), diclofenac, etodolac, ketorolac, Toradol, Daypro, piroxicam, Goody's or BC powders. OK TO USE TYLENOL IF NEEDED   __X__ Stop supplements until after surgery. Biotin 10 MG   ____ Bring  C-Pap to the hospital.    If you have any questions regarding your pre-procedure instructions,  Please call Pre-admit Testing at [phone]

## 2022-01-13 ENCOUNTER — Encounter: Payer: Self-pay | Admitting: Orthopedic Surgery

## 2022-01-13 NOTE — Progress Notes (Signed)
Perioperative Services  Pre-Admission/Anesthesia Testing Clinical Review  Date: 01/15/22  Patient Demographics:  Name: Caitlyn Stephenson DOB:   10/22/1940 MRN:   [id number]  Planned Surgical Procedure(s):    Case: [id number] Date/Time: 01/21/22 0700   Procedure: COMPUTER ASSISTED TOTAL KNEE ARTHROPLASTY (Right: Knee)   Anesthesia type: Choice   Pre-op diagnosis: PRIMARY OSTEOARTHRITIS OF RIGHT KNEE.   Location: [facility] OR ROOM 01 / [place] [facility] FOR ANESTHESIA GROUP   Surgeons: Dereck Leep, MD   NOTE: Available PAT nursing documentation and vital signs have been reviewed. Clinical nursing staff has updated patient's PMH/PSHx, current medication list, and drug allergies/intolerances to ensure comprehensive history available to assist in medical decision making as it pertains to the aforementioned surgical procedure and anticipated anesthetic course. Extensive review of available clinical information performed. Caitlyn Stephenson PMH and PSHx updated with any diagnoses/procedures that  may have been inadvertently omitted during her intake with the pre-admission testing department's nursing staff.  Clinical Discussion:  Caitlyn Stephenson is a 81 y.o. female who is submitted for pre-surgical anesthesia review and clearance prior to her undergoing the above procedure. Patient has never been a smoker. Pertinent PMH includes: SSS/symptomatic bradycardia, HTN, HLD, T2DM, hypothyroidism (s/p thyroidectomy), CKD-III, GERD (on daily PPI), hiatal hernia, asthma, anemia, OA, Alzheimer's dementia.  Patient is followed by cardiology Saralyn Pilar, MD). She was last seen in the cardiology clinic on 10/11/2020; notes reviewed.  At the time of her clinic visit, patient doing well overall from a cardiovascular perspective.  She denied any episodes of chest pain.  Patient with chronic exertional dyspnea that was reported to be stable and at baseline.  Patient denied any PND, orthopnea, significant peripheral edema, palpitations,  vertiginous symptoms, or presyncope/syncope.  Patient with a past medical history significant for cardiovascular diagnoses.  Stress echocardiogram was performed on 04/08/2016 revealing a normal left ventricular systolic function with an EF of >55%.  RVSF normal.  There was mild mitral and tricuspid valve regurgitation.  There was no evidence of a significant transvalvular gradient to suggest stenosis.  Patient developed sick sinus syndrome with resulting symptomatic bradycardia and 07/2017.  She subsequently underwent placement of a Medtronic dual-chamber pacemaker.  Blood pressure was elevated at 152/68 despite prescribed ACEi monotherapy. Patient is on a statin for HLD diagnosis. T2DM reasonably controlled on currently prescribed regimen; last HgbA1c was 7.3% when checked on 11/14/2021.  Again, patient has a Medtronic dual-chamber PPM in place.  Device is regularly interrogated by her primary cardiology team. Functional capacity, as defined by DASI, is documented as being >/= 4 METS.  No changes were made to her medication regimen.  Patient to follow-up with outpatient cardiology in 6 months or sooner if needed.  Caitlyn Stephenson is scheduled for an elective COMPUTER ASSISTED RIGHT TOTAL KNEE ARTHROPLASTY on 01/21/2022 with Dr. Skip Estimable, MD. Given patient's past medical history significant for cardiovascular diagnoses, presurgical cardiac clearance was sought by the PAT team. Per cardiology, "this patient is optimized for surgery and may proceed with the planned procedural course with a LOW risk of significant perioperative cardiovascular complications".  In review of her medication reconciliation, it is noted the patient is on daily antiplatelet therapy. Patient to continue her daily low dose ASA throughout her perioperative course.  Patient reports previous perioperative complications with anesthesia in the past.  Patient has a history of (+) delayed emergence from general anesthesia in the past.   In review of the available records, it is noted that patient underwent a general anesthetic  course here at [facility] (ASA [facility]) in 07/2017 without documented complications.      01/10/2022   12:38 PM 09/30/2018    4:00 PM 09/30/2018    3:45 PM  Vitals with BMI  Height 5\' 2"     Weight 124 lbs    BMI [id number]    Systolic [id number]   Diastolic 69 68   Pulse 72 59 60    Providers/Specialists:   NOTE: Primary physician provider listed below. Patient may have been seen by APP or partner within same practice.   PROVIDER ROLE / SPECIALTY LAST OV  Hooten, Laurice Record, MD Orthopedics (Surgeon) 01/11/2022  Adin Hector, MD Primary Care Provider 11/21/2021  Isaias Cowman, MD Cardiology 10/11/2020  Jennings Books, MD Neurology 10/01/2021   Allergies:  Fish allergy, Garlic, Other, Shellfish allergy, Celecoxib, Hydrocodone-acetaminophen, Meloxicam, Phentermine, and Advil [ibuprofen]  Current Home Medications:   No current facility-administered medications for this encounter.    acetaminophen (TYLENOL) 500 MG tablet   aspirin EC 81 MG tablet   Biotin 10 MG CAPS   busPIRone (BUSPAR) 7.5 MG tablet   calcium carbonate (TUMS - DOSED IN MG ELEMENTAL CALCIUM) 500 MG chewable tablet   Cholecalciferol (VITAMIN D3 PO)   Cyanocobalamin (VITAMIN B 12 PO)   diclofenac sodium (VOLTAREN) 1 % GEL   donepezil (ARICEPT) 5 MG tablet   escitalopram (LEXAPRO) 10 MG tablet   glimepiride (AMARYL) 1 MG tablet   lansoprazole (PREVACID) 15 MG capsule   levothyroxine (SYNTHROID) 88 MCG tablet   lisinopril (PRINIVIL,ZESTRIL) 5 MG tablet   lovastatin (MEVACOR) 20 MG tablet   Menthol, Topical Analgesic, (BIOFREEZE COOL THE PAIN EX)   OVER THE COUNTER MEDICATION   History:   Past Medical History:  Diagnosis Date   Anemia    Angioedema 01/2009   Arthritis    Asthma    Bradycardia 07/14/2017   a.) symptomatic; s/p Medtroinc dual chamber PPM placement   CKD (chronic kidney  disease), stage III (HCC)    Complication of anesthesia    a.) delayed emergence   Edema    GERD (gastroesophageal reflux disease)    Hiatal hernia    High cholesterol    Hypertension    Hypothyroidism    a.) s/p thyroidectomy   Late onset Alzheimer dementia ([place])    a.) on donepezil   LBBB (left bundle branch block) 05/11/2015   Lumbar stenosis with neurogenic claudication    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   a.) Medtronic dual chamber PPM (serial #: [id number])   SSS (sick sinus syndrome) ([place]) 07/14/2017   a.) s/p Medtroinc dual chamber PPM placement   T2DM (type 2 diabetes mellitus) ([place])    Past Surgical History:  Procedure Laterality Date   ABDOMINAL HYSTERECTOMY  1983   APPENDECTOMY     BREAST BIOPSY Right 1965   cyst removed   CHOLECYSTECTOMY     FOOT FUSION Right 03/2012   HYSTERECTOMY ABDOMINAL WITH SALPINGO-OOPHORECTOMY N/A    JOINT REPLACEMENT Right 03/12/2021   in [place] Left 07/14/2017   Procedure: INSERTION [place];  Surgeon: Isaias Cowman, MD;  Location: [facility] ORS;  Service: Cardiovascular;  Laterality: Left;   THYROIDECTOMY Bilateral 1970   TONSILLECTOMY Bilateral    UMBILICAL HERNIA REPAIR N/A 2007   Procedure: UMBILICAL HERNIA REPAIR; Location: [place]; Surgeon: Rochel Brome, MD   Family History  Problem Relation Age of Onset   Breast cancer Neg Hx    Social  History   Tobacco Use   Smoking status: Never   Smokeless tobacco: Never  Vaping Use   Vaping Use: Never used  Substance Use Topics   Alcohol use: No   Drug use: No    Pertinent Clinical Results:  LABS: Labs reviewed: Acceptable for surgery.  No visits with results within 3 Day(s) from this visit.  Latest known visit with results is:  Hospital Outpatient Visit on 01/10/2022  Component Date Value Ref Range Status   ABO/RH(D) 01/10/2022 B NEG   Final   Antibody Screen 01/10/2022 NEG   Final   Sample Expiration 01/10/2022  01/24/2022,2359   Final   Extend sample reason 01/10/2022    Final                   Value:NO TRANSFUSIONS OR PREGNANCY IN THE PAST 3 MONTHS Performed at [facility], [address] [place], [place] [address]    MRSA, PCR 01/10/2022 NEGATIVE  NEGATIVE Final   Staphylococcus aureus 01/10/2022 NEGATIVE  NEGATIVE Final   Comment: (NOTE) The Xpert SA Assay (FDA approved for NASAL specimens in patients 62 years of age and older), is one component of a comprehensive surveillance program. It is not intended to diagnose infection nor to guide or monitor treatment. Performed at [facility], [address] [place], [place] [address]    CRP 01/10/2022 0.8  <1.0 mg/dL Final   Performed at [facility], [address] [place], [place] [address]   Sed Rate 01/10/2022 7  0 - 30 mm/hr Final   Performed at [facility], [address] [place], [place] [address]   Color, Urine 01/10/2022 YELLOW (A)  YELLOW Final   APPearance 01/10/2022 CLEAR (A)  CLEAR Final   Specific Gravity, Urine 01/10/2022 1.012  1.005 - 1.030 Final   pH 01/10/2022 5.0  5.0 - 8.0 Final   Glucose, UA 01/10/2022 NEGATIVE  NEGATIVE mg/dL Final   Hgb urine dipstick 01/10/2022 NEGATIVE  NEGATIVE Final   Bilirubin Urine 01/10/2022 NEGATIVE  NEGATIVE Final   Ketones, ur 01/10/2022 NEGATIVE  NEGATIVE mg/dL Final   Protein, ur 99/37/1696 NEGATIVE  NEGATIVE mg/dL Final   Nitrite 78/93/8101 NEGATIVE  NEGATIVE Final   Leukocytes,Ua 01/10/2022 NEGATIVE  NEGATIVE Final   Performed at [facility], [address] [place], [place] [address]    ECG: Date: 01/10/2022 Time ECG obtained: 1230 PM Rate: 72 bpm Rhythm:  Atrial sensed ventricular paced rhythm Axis (leads I and aVF): Normal Intervals: PR 204 ms. QRS 156 ms. QTc 503 ms. ST segment and T wave changes: No evidence of acute ST segment elevation or depression Comparison: Similar to previous tracing obtained on  09/30/2018   IMAGING / PROCEDURES: DIAGNOSTIC RADIOGRAPHS OF RIGHT KNEE 3 VIEWS performed on 01/11/2022 Severe loss of the medial compartment joint space with significant osteophyte formation. Sunrise view shows loss of the lateral patellofemoral joint space. No fractures or dislocations. No other osseous abnormalities noted.  MRI BRAIN WO CONTRAST performed on 11/12/2021 No acute intracranial process.  No evidence of acute or subacute infarct.  ECHOCARDIOGRAM STRESS TEST performed on 04/08/2016 Normal left ventricular systolic function with an EF of 55% Mild mitral and tricuspid valve regurgitation No aortic or pulmonary valve regurgitation Normal gradients; no valvular stenosis  MYOCARDIAL PERFUSION IMAGING STUDY (LEXISCAN) performed on 04/07/2012 Equivocal Lexiscan stress secondary to left bundle branch block.  LV function reduced although sensitivity limited due to left  bundle branch block.  No evidence of  infarction or ischemia.   Impression and Plan:  Caitlyn Stephenson has been referred for pre-anesthesia review and clearance prior to her undergoing the planned anesthetic and procedural courses. Available labs, pertinent testing, and imaging results were personally reviewed by me. This patient has been appropriately cleared by cardiology with an overall LOW risk of significant perioperative cardiovascular complications. Completed perioperative prescription for cardiac device management documentation completed by primary cardiology team and placed on patient's chart for review by the surgical/anesthetic team on the day of her procedure.    Based on clinical review performed today (01/15/22), barring any significant acute changes in the patient's overall condition, it is anticipated that she will be able to proceed with the planned surgical intervention. Any acute changes in clinical condition may necessitate her procedure being postponed and/or cancelled. Patient will meet with  anesthesia team (MD and/or CRNA) on the day of her procedure for preoperative evaluation/assessment. Questions regarding anesthetic course will be fielded at that time.   Pre-surgical instructions were reviewed with the patient during her PAT appointment and questions were fielded by PAT clinical staff. Patient was advised that if any questions or concerns arise prior to her procedure then she should return a call to PAT and/or her surgeon's office to discuss.  Quentin Mulling, MSN, APRN, FNP-C, CEN [facility]  Peri-operative Services Nurse Practitioner Phone: [phone] Fax: [phone] 01/15/22 12:08 PM  NOTE: This note has been prepared using Dragon dictation software. Despite my best ability to proofread, there is always the potential that unintentional transcriptional errors may still occur from this process.

## 2022-01-20 NOTE — H&P (Signed)
ORTHOPAEDIC HISTORY & PHYSICAL Michelene Gardener, [place] - 01/11/2022 11:00 AM EDT Formatting of this note is different from the original. [facility] Chief Complaint:   Chief Complaint  Patient presents with  Knee Pain  H & P RIGHT KNEE   History of Present Illness:   Caitlyn Stephenson is a 81 y.o. female that presents to clinic today for her preoperative history and evaluation. Patient presents with her son. The patient is scheduled to undergo a right total knee arthroplasty on 01/21/22 by Dr. Ernest Pine. Her pain began many years ago. The pain is located primarily along the lateral aspect of the knee. She describes her pain as worse with weightbearing. She reports associated swelling. She denies associated numbness or tingling, denies locking or giving way of the knee.   The patient's symptoms have progressed to the point that they decrease her quality of life. The patient has previously undergone conservative treatment including NSAIDS and injections to the knee without adequate control of her symptoms.  Denies history of lumbar surgery, denies history of DVT. She does see Dr Darrold Junker. Has history of pacemaker placement and sick sinus syndrome.  Patient does have an aide that comes to the home 3 times weekly, but no one is available to stay with her the first week. Her son would like to consider rehab postoperatively. Patient would prefer a female PT to work with her post-operatively.  Past Medical, Surgical, Family, Social History, Allergies, Medications:   Past Medical History:  Past Medical History:  Diagnosis Date  Angioedema 01/2009  possible; lisinopril stopped originally, but resumed without issue  Bradycardia 05/19/2015  Holter 11/16 with rare SVT, occasional PVCs. Intermittent 1st degree AVB, intermittent bundle branch block, with periodic HR <40. Cardiology consulted.  Bundle branch block, left 05/11/2015  CKD (chronic kidney disease)  stage 3, GFR 30-59 ml/min (CMS-HCC) 05/09/2016  Controlled type 2 diabetes mellitus with complication, without long-term current use of insulin (CMS-HCC) 05/09/2016  Diabetes (CMS-HCC)  adult onset diabetes mellitus  Episodic lightheadedness 05/11/2015  Gastroesophageal reflux disease with hiatal hernia  with hiatal hernia  History of asthma  with allergic component, although no real symptoms with this since 1993, when she tore out the carpet, blinds, and curtains in her house  Hyperlipidemia  Hypertension  Hypothyroidism  with previous thyroidectomy  Iron deficiency anemia  stool heme positive 2014, GI consultation strongly recommended, with pt declining due to her husband's health. UPEP/SPEP negative  Left lumbar radiculitis 11/09/2013  Lumbar stenosis with neurogenic claudication 11/09/2013  Osteoarthritis  mainly involving knees; followed by Dr. Neva Seat. Status post Synvisc injections  Pacemaker 07/24/2017  Dual-chamber pacemaker implantation on 07/14/2017 for SSS  Primary osteoarthritis of right knee 05/29/2016  Reflux  with hiatal hernia  Severe headache 09/2006  seen in the ER, with CT negative; lumbar puncture unremarkable  Type 2 diabetes mellitus without complication (CMS-HCC) 04/25/2014   Past Surgical History:  Past Surgical History:  Procedure Laterality Date  Status post umbilical hernia repair by Dr. Renda Rolls 2007  Right foot fusion 03/2012  Right hip hemiarthroplasty Right 03/12/2021  [place]  CHOLECYSTECTOMY  CHOLECYSTECTOMY  HERNIA REPAIR  History of right breast biopsy, benign per report  HYSTERECTOMY  Hysterectomy with BSO  INSERT / REPLACE / REMOVE PACEMAKER  KNEE ARTHROSCOPY  right  S/P thyroidectomy  TONSILLECTOMY   Current Medications:  Current Outpatient Medications  Medication Sig Dispense Refill  acetaminophen (TYLENOL) 500 mg capsule Take 1,000 mg by mouth  every 8 (eight) hours as needed for Pain. Extra strength  aspirin 81 MG EC  tablet Take 1 tablet (81 mg total) by mouth once daily 30 tablet 0  busPIRone (BUSPAR) 7.5 MG tablet TAKE 2 TABLETS BY MOUTH THREE TIMES DAILY 270 tablet 1  cholecalciferol (VITAMIN D3) 1000 unit tablet Take by mouth once daily.   cyanocobalamin (VITAMIN B12) 1000 MCG tablet Take 1,000 mcg by mouth once daily  donepeziL (ARICEPT) 5 MG tablet Take 1 tablet (5 mg total) by mouth every morning for 180 days 30 tablet 5  glimepiride (AMARYL) 1 MG tablet Take 1 tablet by mouth once daily 90 tablet 1  levothyroxine (SYNTHROID) 88 MCG tablet Take 88 mcg by mouth once daily  lisinopriL (ZESTRIL) 10 MG tablet Take 1 tablet (10 mg total) by mouth once daily 90 tablet 3  lovastatin (MEVACOR) 20 MG tablet TAKE 2 TABLETS BY MOUTH ONCE DAILY WITH SUPPER 180 tablet 1  escitalopram oxalate (LEXAPRO) 10 MG tablet Take 1 tablet (10 mg total) by mouth once daily for 90 days 30 tablet 11   No current facility-administered medications for this visit.   Allergies:  Allergies  Allergen Reactions  Advil [Ibuprofen] Shortness Of Breath  Aleve [Naproxen Sodium] Shortness Of Breath  Hydrocodone-Acetaminophen Hives  Celebrex [Celecoxib] Other (See Comments)  Dyspepsia  Mobic [Meloxicam] Nausea  Phentermine Other (See Comments)  Insomnia   Social History:  Social History   Socioeconomic History  Marital status: Widowed  Number of children: 1  Years of education: 11  Highest education level: 11th grade  Occupational History  Occupation: Retired  Tobacco Use  Smoking status: Never  Smokeless tobacco: Never  Vaping Use  Vaping Use: Never used  Substance and Sexual Activity  Alcohol use: No  Alcohol/week: 0.0 standard drinks  Drug use: Never  Sexual activity: Not Currently  Partners: Male   Family History:  Family History  Problem Relation Age of Onset  Diabetes Other  Alzheimer's disease Mother  Diabetes Mother  Diabetes Father  High blood pressure (Hypertension) Father  Seizures Father   Myocardial Infarction (Heart attack) Brother  Diabetes Brother   Review of Systems:   A 10+ ROS was performed, reviewed, and the pertinent orthopaedic findings are documented in the HPI.   Physical Examination:   BP 130/70 (BP Location: Left upper arm, Patient Position: Sitting, BP Cuff Size: Adult)  Ht 157.5 cm (5\' 2" )  Wt 67.7 kg (149 lb 3.2 oz)  LMP (LMP Unknown) Comment: Hysterectomy  BMI 27.29 kg/m   Patient is a well-developed, well-nourished female in no acute distress. Patient has normal mood and affect. Patient is alert and oriented to person, place, and time.   HEENT: Atraumatic, normocephalic. Pupils equal and reactive to light. Extraocular motion intact. Noninjected sclera.  Cardiovascular: Regular rate and rhythm, with no murmurs, rubs, or gallops. Distal pulses palpable.  Respiratory: Lungs clear to auscultation bilaterally.   Right Knee: Soft tissue swelling: mild Effusion: none Erythema: none Crepitance: minimal Tenderness: lateral Alignment: relative valgus Mediolateral laxity: lateral pseudolaxity Posterior sag: negative Patellar tracking: Good tracking without evidence of subluxation or tilt Atrophy: Generalized quadriceps atrophy.  Quadriceps tone was fair to good. Range of motion: 0/5/121 degrees  Patient able to plantarflex and dorsiflex ankle. Able to flex and extend the toes.  Sensation intact over the saphenous, lateral sural cutaneous, superficial fibular, and deep fibular nerve distributions.  Tests Performed/Reviewed:  X-rays  X-ray knee right 3 views  Result Date: 01/11/2022 3 views of the right  knee were obtained. Images reveal severe loss of medial compartment joint space with significant osteophyte formation. Sunrise view shows loss of lateral patellofemoral joint space. No fractures or dislocations. No osseous abnormality noted.  I personally ordered and reviewed the xrays.   Impression:   ICD-10-CM  1. Primary osteoarthritis of  right knee M17.11   Plan:   The patient has end-stage degenerative changes of the right knee. It was explained to the patient that the condition is progressive in nature. Having failed conservative treatment, the patient has elected to proceed with a total joint arthroplasty. The patient will undergo a total joint arthroplasty with Dr. Marry Guan. The risks of surgery, including blood clot and infection, were discussed with the patient. Measures to reduce these risks, including the use of anticoagulation, perioperative antibiotics, and early ambulation were discussed. The importance of postoperative physical therapy was discussed with the patient. The patient elects to proceed with surgery. The patient is instructed to stop all blood thinners prior to surgery. The patient is instructed to call the hospital the day before surgery to learn of the proper arrival time.   Contact our office with any questions or concerns. Follow up as indicated, or sooner should any new problems arise, if conditions worsen, or if they are otherwise concerned.   Gwenlyn Fudge, [facility] [place], [place] [address] Phone: [phone]  This note was generated in part with voice recognition software and I apologize for any typographical errors that were not detected and corrected.  Electronically signed by Gwenlyn Fudge, PA at 01/14/2022 5:26 PM EDT

## 2022-01-21 ENCOUNTER — Encounter
Admission: RE | Disposition: A | Discharge status: Skilled Nursing Facility | Payer: Self-pay | Source: Home / Self Care | Attending: Orthopedic Surgery

## 2022-01-21 ENCOUNTER — Inpatient Hospital Stay: Payer: Medicare HMO | Admitting: Urgent Care

## 2022-01-21 ENCOUNTER — Inpatient Hospital Stay
Admission: RE | Admit: 2022-01-21 | Discharge: 2022-01-24 | DRG: 470 | Disposition: A | Discharge status: Skilled Nursing Facility | Payer: Medicare HMO | Attending: Orthopedic Surgery | Admitting: Orthopedic Surgery

## 2022-01-21 ENCOUNTER — Encounter: Payer: Self-pay | Admitting: Orthopedic Surgery

## 2022-01-21 ENCOUNTER — Inpatient Hospital Stay: Payer: Medicare HMO

## 2022-01-21 ENCOUNTER — Other Ambulatory Visit: Payer: Self-pay

## 2022-01-21 DIAGNOSIS — Z96659 Presence of unspecified artificial knee joint: Secondary | ICD-10-CM | POA: Diagnosis present

## 2022-01-21 DIAGNOSIS — Z79899 Other long term (current) drug therapy: Secondary | ICD-10-CM

## 2022-01-21 DIAGNOSIS — Z885 Allergy status to narcotic agent status: Secondary | ICD-10-CM | POA: Diagnosis not present

## 2022-01-21 DIAGNOSIS — I447 Left bundle-branch block, unspecified: Secondary | ICD-10-CM | POA: Diagnosis present

## 2022-01-21 DIAGNOSIS — K219 Gastro-esophageal reflux disease without esophagitis: Secondary | ICD-10-CM | POA: Diagnosis present

## 2022-01-21 DIAGNOSIS — M1711 Unilateral primary osteoarthritis, right knee: Secondary | ICD-10-CM | POA: Diagnosis present

## 2022-01-21 DIAGNOSIS — E890 Postprocedural hypothyroidism: Secondary | ICD-10-CM | POA: Diagnosis present

## 2022-01-21 DIAGNOSIS — G301 Alzheimer's disease with late onset: Secondary | ICD-10-CM | POA: Diagnosis present

## 2022-01-21 DIAGNOSIS — Z833 Family history of diabetes mellitus: Secondary | ICD-10-CM

## 2022-01-21 DIAGNOSIS — Z820 Family history of epilepsy and other diseases of the nervous system: Secondary | ICD-10-CM

## 2022-01-21 DIAGNOSIS — Z886 Allergy status to analgesic agent status: Secondary | ICD-10-CM | POA: Diagnosis not present

## 2022-01-21 DIAGNOSIS — Z79890 Hormone replacement therapy (postmenopausal): Secondary | ICD-10-CM

## 2022-01-21 DIAGNOSIS — Z8249 Family history of ischemic heart disease and other diseases of the circulatory system: Secondary | ICD-10-CM

## 2022-01-21 DIAGNOSIS — Z01812 Encounter for preprocedural laboratory examination: Secondary | ICD-10-CM

## 2022-01-21 DIAGNOSIS — Z7984 Long term (current) use of oral hypoglycemic drugs: Secondary | ICD-10-CM | POA: Diagnosis not present

## 2022-01-21 DIAGNOSIS — I129 Hypertensive chronic kidney disease with stage 1 through stage 4 chronic kidney disease, or unspecified chronic kidney disease: Secondary | ICD-10-CM | POA: Diagnosis present

## 2022-01-21 DIAGNOSIS — Z91013 Allergy to seafood: Secondary | ICD-10-CM | POA: Diagnosis not present

## 2022-01-21 DIAGNOSIS — I495 Sick sinus syndrome: Secondary | ICD-10-CM | POA: Diagnosis present

## 2022-01-21 DIAGNOSIS — F0280 Dementia in other diseases classified elsewhere without behavioral disturbance: Secondary | ICD-10-CM | POA: Diagnosis present

## 2022-01-21 DIAGNOSIS — Z91018 Allergy to other foods: Secondary | ICD-10-CM

## 2022-01-21 DIAGNOSIS — M1990 Unspecified osteoarthritis, unspecified site: Secondary | ICD-10-CM | POA: Insufficient documentation

## 2022-01-21 DIAGNOSIS — Z950 Presence of cardiac pacemaker: Secondary | ICD-10-CM

## 2022-01-21 DIAGNOSIS — I10 Essential (primary) hypertension: Secondary | ICD-10-CM | POA: Insufficient documentation

## 2022-01-21 DIAGNOSIS — E785 Hyperlipidemia, unspecified: Secondary | ICD-10-CM | POA: Insufficient documentation

## 2022-01-21 DIAGNOSIS — E7800 Pure hypercholesterolemia, unspecified: Secondary | ICD-10-CM | POA: Diagnosis present

## 2022-01-21 DIAGNOSIS — Z8709 Personal history of other diseases of the respiratory system: Secondary | ICD-10-CM | POA: Insufficient documentation

## 2022-01-21 DIAGNOSIS — J45909 Unspecified asthma, uncomplicated: Secondary | ICD-10-CM | POA: Diagnosis present

## 2022-01-21 DIAGNOSIS — E039 Hypothyroidism, unspecified: Secondary | ICD-10-CM | POA: Insufficient documentation

## 2022-01-21 DIAGNOSIS — E1122 Type 2 diabetes mellitus with diabetic chronic kidney disease: Secondary | ICD-10-CM | POA: Diagnosis present

## 2022-01-21 DIAGNOSIS — Z7982 Long term (current) use of aspirin: Secondary | ICD-10-CM | POA: Diagnosis not present

## 2022-01-21 DIAGNOSIS — N183 Chronic kidney disease, stage 3 unspecified: Secondary | ICD-10-CM | POA: Diagnosis present

## 2022-01-21 DIAGNOSIS — K449 Diaphragmatic hernia without obstruction or gangrene: Secondary | ICD-10-CM | POA: Insufficient documentation

## 2022-01-21 DIAGNOSIS — D509 Iron deficiency anemia, unspecified: Secondary | ICD-10-CM | POA: Insufficient documentation

## 2022-01-21 HISTORY — DX: Dementia in other diseases classified elsewhere, unspecified severity, without behavioral disturbance, psychotic disturbance, mood disturbance, and anxiety: F02.80

## 2022-01-21 HISTORY — PX: KNEE ARTHROPLASTY: SHX992

## 2022-01-21 HISTORY — DX: Type 2 diabetes mellitus without complications: E11.9

## 2022-01-21 HISTORY — DX: Spinal stenosis, lumbar region with neurogenic claudication: M48.062

## 2022-01-21 HISTORY — DX: Diaphragmatic hernia without obstruction or gangrene: K44.9

## 2022-01-21 LAB — GLUCOSE, CAPILLARY
Glucose-Capillary: 90 mg/dL (ref 70–99)
Glucose-Capillary: 154 mg/dL — ABNORMAL HIGH (ref 70–99)
Glucose-Capillary: 186 mg/dL — ABNORMAL HIGH (ref 70–99)
Glucose-Capillary: 295 mg/dL — ABNORMAL HIGH (ref 70–99)
Glucose-Capillary: 237 mg/dL — ABNORMAL HIGH (ref 70–99)

## 2022-01-21 LAB — ABO/RH: ABO/RH(D): B NEG

## 2022-01-21 SURGERY — ARTHROPLASTY, KNEE, TOTAL, USING IMAGELESS COMPUTER-ASSISTED NAVIGATION
Anesthesia: Spinal | Site: Knee | Laterality: Right

## 2022-01-21 MED ORDER — PROPOFOL 10 MG/ML IV BOLUS
INTRAVENOUS | Status: AC
  Filled 2022-01-21: qty 60

## 2022-01-21 MED ORDER — DEXAMETHASONE SODIUM PHOSPHATE 10 MG/ML IJ SOLN: 8.0000 mg | Freq: Once | INTRAMUSCULAR | Status: AC

## 2022-01-21 MED ORDER — MAGNESIUM HYDROXIDE 400 MG/5ML PO SUSP
30.0000 mL | Freq: Every day | ORAL | Status: DC
  Administered 2022-01-21 – 2022-01-24 (×4): 30 mL via ORAL
  Filled 2022-01-21 (×4): qty 30

## 2022-01-21 MED ORDER — PROPOFOL 500 MG/50ML IV EMUL
INTRAVENOUS | Status: DC | PRN
  Administered 2022-01-21: 75 ug/kg/min via INTRAVENOUS

## 2022-01-21 MED ORDER — PHENOL 1.4 % MT LIQD
1.0000 | OROMUCOSAL | Status: DC | PRN
Start: 2022-01-21 — End: 2022-01-24
  Administered 2022-01-21: 1 via OROMUCOSAL
  Filled 2022-01-21: qty 177

## 2022-01-21 MED ORDER — BUPIVACAINE HCL (PF) 0.5 % IJ SOLN
INTRAMUSCULAR | Status: DC | PRN
  Administered 2022-01-21: 3 mL via INTRATHECAL

## 2022-01-21 MED ORDER — ACETAMINOPHEN 10 MG/ML IV SOLN
INTRAVENOUS | Status: DC | PRN
  Administered 2022-01-21: 1000 mg via INTRAVENOUS

## 2022-01-21 MED ORDER — VITAMIN B-12 1000 MCG PO TABS
1000.0000 ug | ORAL_TABLET | Freq: Every day | ORAL | Status: DC
  Administered 2022-01-22 – 2022-01-24 (×3): 1000 ug via ORAL
  Filled 2022-01-21 (×3): qty 1

## 2022-01-21 MED ORDER — CHLORHEXIDINE GLUCONATE 0.12 % MT SOLN
OROMUCOSAL | Status: AC
  Administered 2022-01-21: 15 mL via OROMUCOSAL
  Filled 2022-01-21: qty 15

## 2022-01-21 MED ORDER — CEFAZOLIN SODIUM-DEXTROSE 2-4 GM/100ML-% IV SOLN
2.0000 g | Freq: Four times a day (QID) | INTRAVENOUS | Status: AC
  Administered 2022-01-21: 2 g via INTRAVENOUS
  Filled 2022-01-21 (×2): qty 100

## 2022-01-21 MED ORDER — DEXAMETHASONE SODIUM PHOSPHATE 10 MG/ML IJ SOLN
INTRAMUSCULAR | Status: AC
  Administered 2022-01-21: 8 mg via INTRAVENOUS
  Filled 2022-01-21: qty 1

## 2022-01-21 MED ORDER — ONDANSETRON HCL 4 MG/2ML IJ SOLN: 4.0000 mg | Freq: Four times a day (QID) | INTRAMUSCULAR | Status: DC | PRN

## 2022-01-21 MED ORDER — GABAPENTIN 300 MG PO CAPS
ORAL_CAPSULE | ORAL | Status: AC
  Administered 2022-01-21: 300 mg via ORAL
  Filled 2022-01-21: qty 1

## 2022-01-21 MED ORDER — FENTANYL CITRATE (PF) 100 MCG/2ML IJ SOLN
INTRAMUSCULAR | Status: AC
  Filled 2022-01-21: qty 2

## 2022-01-21 MED ORDER — BUSPIRONE HCL 15 MG PO TABS
7.5000 mg | ORAL_TABLET | Freq: Three times a day (TID) | ORAL | Status: DC
  Administered 2022-01-21 – 2022-01-24 (×9): 7.5 mg via ORAL
  Filled 2022-01-21 (×10): qty 1

## 2022-01-21 MED ORDER — LEVOTHYROXINE SODIUM 100 MCG PO TABS
100.0000 ug | ORAL_TABLET | Freq: Every day | ORAL | Status: DC
  Administered 2022-01-22 – 2022-01-24 (×3): 100 ug via ORAL
  Filled 2022-01-21 (×3): qty 1

## 2022-01-21 MED ORDER — HYDROMORPHONE HCL 1 MG/ML IJ SOLN
0.5000 mg | INTRAMUSCULAR | Status: DC | PRN
  Filled 2022-01-21: qty 1

## 2022-01-21 MED ORDER — ACETAMINOPHEN 10 MG/ML IV SOLN
1000.0000 mg | Freq: Four times a day (QID) | INTRAVENOUS | Status: AC
  Administered 2022-01-21 – 2022-01-22 (×4): 1000 mg via INTRAVENOUS
  Filled 2022-01-21 (×5): qty 100

## 2022-01-21 MED ORDER — LISINOPRIL 10 MG PO TABS
10.0000 mg | ORAL_TABLET | Freq: Every day | ORAL | Status: DC
  Administered 2022-01-21 – 2022-01-23 (×3): 10 mg via ORAL
  Filled 2022-01-21 (×4): qty 1

## 2022-01-21 MED ORDER — DONEPEZIL HCL 5 MG PO TABS
5.0000 mg | ORAL_TABLET | ORAL | Status: DC
  Administered 2022-01-22 – 2022-01-24 (×3): 5 mg via ORAL
  Filled 2022-01-21 (×3): qty 1

## 2022-01-21 MED ORDER — PHENYLEPHRINE HCL-NACL 20-0.9 MG/250ML-% IV SOLN
INTRAVENOUS | Status: DC | PRN
  Administered 2022-01-21: 20 ug/min via INTRAVENOUS

## 2022-01-21 MED ORDER — BISACODYL 10 MG RE SUPP
10.0000 mg | Freq: Every day | RECTAL | Status: DC | PRN
  Administered 2022-01-23: 10 mg via RECTAL
  Filled 2022-01-21 (×2): qty 1

## 2022-01-21 MED ORDER — GABAPENTIN 300 MG PO CAPS: 300.0000 mg | ORAL_CAPSULE | Freq: Once | ORAL | Status: AC

## 2022-01-21 MED ORDER — TRANEXAMIC ACID-NACL 1000-0.7 MG/100ML-% IV SOLN
1000.0000 mg | INTRAVENOUS | Status: AC
  Administered 2022-01-21: 1000 mg via INTRAVENOUS

## 2022-01-21 MED ORDER — TRANEXAMIC ACID-NACL 1000-0.7 MG/100ML-% IV SOLN
INTRAVENOUS | Status: AC
  Filled 2022-01-21: qty 100

## 2022-01-21 MED ORDER — TRAMADOL HCL 50 MG PO TABS
50.0000 mg | ORAL_TABLET | ORAL | Status: DC | PRN
  Administered 2022-01-22: 100 mg via ORAL
  Filled 2022-01-21: qty 2

## 2022-01-21 MED ORDER — GLIMEPIRIDE 1 MG PO TABS
0.5000 mg | ORAL_TABLET | Freq: Every day | ORAL | Status: DC
  Administered 2022-01-23 – 2022-01-24 (×2): 0.5 mg via ORAL
  Filled 2022-01-21 (×3): qty 0.5

## 2022-01-21 MED ORDER — GLYCOPYRROLATE 0.2 MG/ML IJ SOLN
INTRAMUSCULAR | Status: DC | PRN
  Administered 2022-01-21: .2 mg via INTRAVENOUS

## 2022-01-21 MED ORDER — ENOXAPARIN SODIUM 30 MG/0.3ML IJ SOSY
30.0000 mg | PREFILLED_SYRINGE | Freq: Two times a day (BID) | INTRAMUSCULAR | Status: DC
Start: 2022-01-22 — End: 2022-01-24
  Administered 2022-01-22 – 2022-01-24 (×5): 30 mg via SUBCUTANEOUS
  Filled 2022-01-21 (×5): qty 0.3

## 2022-01-21 MED ORDER — SODIUM CHLORIDE 0.9 % IR SOLN
Status: DC | PRN
  Administered 2022-01-21: 3000 mL

## 2022-01-21 MED ORDER — PANTOPRAZOLE SODIUM 40 MG PO TBEC
40.0000 mg | DELAYED_RELEASE_TABLET | Freq: Two times a day (BID) | ORAL | Status: DC
  Administered 2022-01-21 – 2022-01-24 (×6): 40 mg via ORAL
  Filled 2022-01-21 (×6): qty 1

## 2022-01-21 MED ORDER — PROPOFOL 10 MG/ML IV BOLUS
INTRAVENOUS | Status: DC | PRN
  Administered 2022-01-21: 50 mg via INTRAVENOUS

## 2022-01-21 MED ORDER — CALCIUM CARBONATE ANTACID 500 MG PO CHEW
1.0000 | CHEWABLE_TABLET | Freq: Every day | ORAL | Status: DC
  Administered 2022-01-22 – 2022-01-24 (×3): 200 mg via ORAL
  Filled 2022-01-21 (×3): qty 1

## 2022-01-21 MED ORDER — INSULIN ASPART 100 UNIT/ML IJ SOLN
0.0000 [IU] | Freq: Three times a day (TID) | INTRAMUSCULAR | Status: DC
  Administered 2022-01-21: 8 [IU] via SUBCUTANEOUS
  Administered 2022-01-23 (×2): 3 [IU] via SUBCUTANEOUS
  Administered 2022-01-24: 2 [IU] via SUBCUTANEOUS
  Filled 2022-01-21 (×4): qty 1

## 2022-01-21 MED ORDER — BUPIVACAINE HCL (PF) 0.25 % IJ SOLN
INTRAMUSCULAR | Status: DC | PRN
  Administered 2022-01-21: 60 mL

## 2022-01-21 MED ORDER — FAMOTIDINE 20 MG PO TABS
ORAL_TABLET | ORAL | Status: AC
  Filled 2022-01-21: qty 1

## 2022-01-21 MED ORDER — SODIUM CHLORIDE 0.9 % IV SOLN: INTRAVENOUS | Status: DC

## 2022-01-21 MED ORDER — ONDANSETRON HCL 4 MG PO TABS: 4.0000 mg | ORAL_TABLET | Freq: Four times a day (QID) | ORAL | Status: DC | PRN

## 2022-01-21 MED ORDER — 0.9 % SODIUM CHLORIDE (POUR BTL) OPTIME
TOPICAL | Status: DC | PRN
  Administered 2022-01-21: 500 mL

## 2022-01-21 MED ORDER — ACETAMINOPHEN 325 MG PO TABS
325.0000 mg | ORAL_TABLET | Freq: Four times a day (QID) | ORAL | Status: DC | PRN
  Administered 2022-01-22 – 2022-01-24 (×3): 650 mg via ORAL
  Filled 2022-01-21 (×3): qty 2

## 2022-01-21 MED ORDER — CHLORHEXIDINE GLUCONATE 4 % EX LIQD: 60.0000 mL | Freq: Once | CUTANEOUS | Status: DC

## 2022-01-21 MED ORDER — ONDANSETRON HCL 4 MG/2ML IJ SOLN
INTRAMUSCULAR | Status: DC | PRN
  Administered 2022-01-21: 4 mg via INTRAVENOUS

## 2022-01-21 MED ORDER — OXYCODONE HCL 5 MG PO TABS
5.0000 mg | ORAL_TABLET | ORAL | Status: DC | PRN
  Administered 2022-01-22: 5 mg via ORAL
  Filled 2022-01-21: qty 1

## 2022-01-21 MED ORDER — TRANEXAMIC ACID-NACL 1000-0.7 MG/100ML-% IV SOLN
INTRAVENOUS | Status: AC
  Administered 2022-01-21: 1000 mg via INTRAVENOUS
  Filled 2022-01-21: qty 100

## 2022-01-21 MED ORDER — SURGIPHOR WOUND IRRIGATION SYSTEM - OPTIME
TOPICAL | Status: DC | PRN
  Administered 2022-01-21: 450 mL via TOPICAL

## 2022-01-21 MED ORDER — ESCITALOPRAM OXALATE 10 MG PO TABS
10.0000 mg | ORAL_TABLET | Freq: Every day | ORAL | Status: DC
  Administered 2022-01-22 – 2022-01-24 (×3): 10 mg via ORAL
  Filled 2022-01-21 (×3): qty 1

## 2022-01-21 MED ORDER — CEFAZOLIN SODIUM-DEXTROSE 2-4 GM/100ML-% IV SOLN
2.0000 g | INTRAVENOUS | Status: AC
  Administered 2022-01-21: 2 g via INTRAVENOUS

## 2022-01-21 MED ORDER — SODIUM CHLORIDE 0.9 % IV SOLN
INTRAVENOUS | Status: DC | PRN
  Administered 2022-01-21: 60 mL

## 2022-01-21 MED ORDER — CHLORHEXIDINE GLUCONATE 0.12 % MT SOLN: 15.0000 mL | Freq: Once | OROMUCOSAL | Status: AC

## 2022-01-21 MED ORDER — INSULIN ASPART 100 UNIT/ML IJ SOLN
0.0000 [IU] | Freq: Every day | INTRAMUSCULAR | Status: DC
  Administered 2022-01-21: 2 [IU] via SUBCUTANEOUS
  Filled 2022-01-21: qty 1

## 2022-01-21 MED ORDER — ALUM & MAG HYDROXIDE-SIMETH 200-200-20 MG/5ML PO SUSP: 30.0000 mL | ORAL | Status: DC | PRN

## 2022-01-21 MED ORDER — MENTHOL 3 MG MT LOZG
1.0000 | LOZENGE | OROMUCOSAL | Status: DC | PRN
Start: 2022-01-21 — End: 2022-01-24
  Filled 2022-01-21: qty 9

## 2022-01-21 MED ORDER — DIPHENHYDRAMINE HCL 12.5 MG/5ML PO ELIX: 12.5000 mg | ORAL_SOLUTION | ORAL | Status: DC | PRN

## 2022-01-21 MED ORDER — VITAMIN D 25 MCG (1000 UNIT) PO TABS
1000.0000 [IU] | ORAL_TABLET | Freq: Every day | ORAL | Status: DC
  Administered 2022-01-22 – 2022-01-24 (×3): 1000 [IU] via ORAL
  Filled 2022-01-21 (×4): qty 1

## 2022-01-21 MED ORDER — OXYCODONE HCL 5 MG PO TABS
10.0000 mg | ORAL_TABLET | ORAL | Status: DC | PRN
  Administered 2022-01-21 – 2022-01-23 (×2): 10 mg via ORAL
  Filled 2022-01-21 (×2): qty 2

## 2022-01-21 MED ORDER — PRAVASTATIN SODIUM 20 MG PO TABS
20.0000 mg | ORAL_TABLET | Freq: Every day | ORAL | Status: DC
  Administered 2022-01-21 – 2022-01-23 (×3): 20 mg via ORAL
  Filled 2022-01-21 (×3): qty 1

## 2022-01-21 MED ORDER — CEFAZOLIN SODIUM-DEXTROSE 2-4 GM/100ML-% IV SOLN
INTRAVENOUS | Status: AC
  Administered 2022-01-21: 2 g via INTRAVENOUS
  Filled 2022-01-21: qty 100

## 2022-01-21 MED ORDER — FLEET ENEMA 7-19 GM/118ML RE ENEM: 1.0000 | ENEMA | Freq: Once | RECTAL | Status: DC | PRN

## 2022-01-21 MED ORDER — ACETAMINOPHEN 10 MG/ML IV SOLN
INTRAVENOUS | Status: AC
  Filled 2022-01-21: qty 100

## 2022-01-21 MED ORDER — SENNOSIDES-DOCUSATE SODIUM 8.6-50 MG PO TABS
1.0000 | ORAL_TABLET | Freq: Two times a day (BID) | ORAL | Status: DC
  Administered 2022-01-21 – 2022-01-24 (×6): 1 via ORAL
  Filled 2022-01-21 (×7): qty 1

## 2022-01-21 MED ORDER — METOCLOPRAMIDE HCL 5 MG PO TABS
10.0000 mg | ORAL_TABLET | Freq: Three times a day (TID) | ORAL | Status: AC
  Administered 2022-01-21 – 2022-01-23 (×7): 10 mg via ORAL
  Filled 2022-01-21 (×7): qty 2

## 2022-01-21 MED ORDER — ORAL CARE MOUTH RINSE: 15.0000 mL | Freq: Once | OROMUCOSAL | Status: AC

## 2022-01-21 MED ORDER — TRANEXAMIC ACID-NACL 1000-0.7 MG/100ML-% IV SOLN: 1000.0000 mg | Freq: Once | INTRAVENOUS | Status: AC

## 2022-01-21 MED ORDER — FERROUS SULFATE 325 (65 FE) MG PO TABS
325.0000 mg | ORAL_TABLET | Freq: Two times a day (BID) | ORAL | Status: DC
  Administered 2022-01-21 – 2022-01-24 (×5): 325 mg via ORAL
  Filled 2022-01-21 (×5): qty 1

## 2022-01-21 MED ORDER — NEOMYCIN-POLYMYXIN B GU 40-200000 IR SOLN
Status: DC | PRN
  Administered 2022-01-21: 14 mL

## 2022-01-21 SURGICAL SUPPLY — 77 items
ATTUNE PS FEM RT SZ 3 CEM KNEE (Femur) ×1 IMPLANT
ATTUNE PSRP INSR SZ3 6 KNEE (Insert) ×1 IMPLANT
BASE TIBIAL ROT PLAT SZ 3 KNEE (Knees) IMPLANT
BATTERY INSTRU NAVIGATION (MISCELLANEOUS) ×8 IMPLANT
BLADE SAW 70X12.5 (BLADE) ×2 IMPLANT
BLADE SAW 90X13X1.19 OSCILLAT (BLADE) ×2 IMPLANT
BLADE SAW 90X25X1.19 OSCILLAT (BLADE) ×2 IMPLANT
BONE CEMENT GENTAMICIN (Cement) ×4 IMPLANT
CEMENT BONE GENTAMICIN 40 (Cement) IMPLANT
CEMENT HV SMART SET (Cement) IMPLANT
COOLER POLAR GLACIER W/PUMP (MISCELLANEOUS) ×2 IMPLANT
CUFF TOURN SGL QUICK 24 (TOURNIQUET CUFF)
CUFF TOURN SGL QUICK 34 (TOURNIQUET CUFF)
CUFF TRNQT CYL 24X4X16.5-23 (TOURNIQUET CUFF) IMPLANT
CUFF TRNQT CYL 34X4.125X (TOURNIQUET CUFF) IMPLANT
DRAPE 3/4 80X56 (DRAPES) ×2 IMPLANT
DRAPE INCISE IOBAN 66X45 STRL (DRAPES) IMPLANT
DRSG DERMACEA 8X12 NADH (GAUZE/BANDAGES/DRESSINGS) ×1 IMPLANT
DRSG DERMACEA NONADH 3X8 (GAUZE/BANDAGES/DRESSINGS) ×2 IMPLANT
DRSG MEPILEX SACRM 8.7X9.8 (GAUZE/BANDAGES/DRESSINGS) ×2 IMPLANT
DRSG OPSITE POSTOP 4X14 (GAUZE/BANDAGES/DRESSINGS) ×2 IMPLANT
DRSG TEGADERM 4X4.75 (GAUZE/BANDAGES/DRESSINGS) ×2 IMPLANT
DURAPREP 26ML APPLICATOR (WOUND CARE) ×4 IMPLANT
ELECT CAUTERY BLADE 6.4 (BLADE) ×2 IMPLANT
ELECT REM PT RETURN 9FT ADLT (ELECTROSURGICAL) ×2
ELECTRODE REM PT RTRN 9FT ADLT (ELECTROSURGICAL) ×1 IMPLANT
EX-PIN ORTHOLOCK NAV 4X150 (PIN) ×4 IMPLANT
GLOVE BIO SURGEON STRL SZ7 (GLOVE) ×4 IMPLANT
GLOVE BIOGEL M STRL SZ7.5 (GLOVE) ×4 IMPLANT
GLOVE BIOGEL PI IND STRL 8 (GLOVE) ×1 IMPLANT
GLOVE BIOGEL PI INDICATOR 8 (GLOVE) ×1
GLOVE BIOGEL PI ORTHO PRO 7.5 (GLOVE) ×2
GLOVE PI ORTHO PRO STRL 7.5 (GLOVE) IMPLANT
GLOVE SURG UNDER POLY LF SZ7.5 (GLOVE) ×2 IMPLANT
GOWN STRL REUS W/ TWL LRG LVL3 (GOWN DISPOSABLE) ×2 IMPLANT
GOWN STRL REUS W/ TWL XL LVL3 (GOWN DISPOSABLE) ×1 IMPLANT
GOWN STRL REUS W/TWL LRG LVL3 (GOWN DISPOSABLE) ×2
GOWN STRL REUS W/TWL XL LVL3 (GOWN DISPOSABLE) ×1
HEMOVAC 400CC 10FR (MISCELLANEOUS) ×2 IMPLANT
HOLDER FOLEY CATH W/STRAP (MISCELLANEOUS) ×2 IMPLANT
HOLSTER ELECTROSUGICAL PENCIL (MISCELLANEOUS) ×2 IMPLANT
HOOD PEEL AWAY FLYTE STAYCOOL (MISCELLANEOUS) ×4 IMPLANT
IV NS IRRIG 3000ML ARTHROMATIC (IV SOLUTION) ×2 IMPLANT
KIT TURNOVER KIT A (KITS) ×2 IMPLANT
KNIFE SCULPS 14X20 (INSTRUMENTS) ×2 IMPLANT
MANIFOLD NEPTUNE II (INSTRUMENTS) ×4 IMPLANT
NDL SPNL 20GX3.5 QUINCKE YW (NEEDLE) ×2 IMPLANT
NEEDLE SPNL 20GX3.5 QUINCKE YW (NEEDLE) ×4 IMPLANT
NS IRRIG 500ML POUR BTL (IV SOLUTION) ×2 IMPLANT
PACK TOTAL KNEE (MISCELLANEOUS) ×2 IMPLANT
PAD ABD DERMACEA PRESS 5X9 (GAUZE/BANDAGES/DRESSINGS) ×4 IMPLANT
PAD WRAPON POLAR KNEE (MISCELLANEOUS) ×1 IMPLANT
PATELLA MEDIAL ATTUN 35MM KNEE (Knees) ×1 IMPLANT
PIN DRILL FIX HALF THREAD (BIT) ×4 IMPLANT
PIN DRILL QUICK PACK ×4 IMPLANT
PIN FIXATION 1/8DIA X 3INL (PIN) ×2 IMPLANT
PULSAVAC PLUS IRRIG FAN TIP (DISPOSABLE) ×2
SOL PREP PVP 2OZ (MISCELLANEOUS) ×2
SOLUTION IRRIG SURGIPHOR (IV SOLUTION) ×2 IMPLANT
SOLUTION PREP PVP 2OZ (MISCELLANEOUS) ×1 IMPLANT
SPONGE DRAIN TRACH 4X4 STRL 2S (GAUZE/BANDAGES/DRESSINGS) ×2 IMPLANT
STAPLER SKIN PROX 35W (STAPLE) ×2 IMPLANT
STOCKINETTE IMPERV 14X48 (MISCELLANEOUS) ×1 IMPLANT
STRAP TIBIA SHORT (MISCELLANEOUS) ×2 IMPLANT
SUCTION FRAZIER HANDLE 10FR (MISCELLANEOUS) ×1
SUCTION TUBE FRAZIER 10FR DISP (MISCELLANEOUS) ×1 IMPLANT
SUT VIC AB 0 CT1 36 (SUTURE) ×4 IMPLANT
SUT VIC AB 1 CT1 36 (SUTURE) ×4 IMPLANT
SUT VIC AB 2-0 CT2 27 (SUTURE) ×2 IMPLANT
SYR 30ML LL (SYRINGE) ×4 IMPLANT
TIBIAL BASE ROT PLAT SZ 3 KNEE (Knees) ×2 IMPLANT
TIP FAN IRRIG PULSAVAC PLUS (DISPOSABLE) ×1 IMPLANT
TOWEL OR 17X26 4PK STRL BLUE (TOWEL DISPOSABLE) IMPLANT
TOWER CARTRIDGE SMART MIX (DISPOSABLE) ×2 IMPLANT
TRAY FOLEY MTR SLVR 16FR STAT (SET/KITS/TRAYS/PACK) ×2 IMPLANT
WATER STERILE IRR 500ML POUR (IV SOLUTION) ×2 IMPLANT
WRAPON POLAR PAD KNEE (MISCELLANEOUS) ×2

## 2022-01-21 NOTE — Progress Notes (Signed)
Patient has incontinence and wear briefs constantly at home. Patient is asking to be able to wear our briefs here or use a purewick. Patient educated that we cannot use a purewick on her for she is a postop patient, and we encourage mobility tremendously. Patient asking for briefs in the meantime to have something on. Per Leavy Cella AD, we can use our briefs for tonight until tomorrow am. Patient states her son will bring her some from home. Patient educated on mobility and encouraged to get up to try to use the bathroom periodically, as well as to try to walk with assist. Patient verbalized understanding.

## 2022-01-21 NOTE — Anesthesia Preprocedure Evaluation (Addendum)
Anesthesia Evaluation  Patient identified by MRN, date of birth, ID band Patient awake    Reviewed: Allergy & Precautions, NPO status , Patient's Chart, lab work & pertinent test results  Airway Mallampati: III  TM Distance: >3 FB Neck ROM: full    Dental  (+) Chipped   Pulmonary asthma ,    Pulmonary exam normal        Cardiovascular METS: 3 - Mets hypertension, Pt. on medications Normal cardiovascular exam+ dysrhythmias + pacemaker ( s/p Medtroinc dual chamber PPM placement)   Pacemaker dependent, chart reviewed  - Stress echocardiogram was performed on 04/08/2016 revealing a normal left ventricular systolic function with an EF of >55%.  RVSF normal.  There was mild mitral and tricuspid valve regurgitation.  There was no evidence of a significant transvalvular gradient to suggest stenosis.  - Patient developed sick sinus syndrome with resulting symptomatic bradycardia and 07/2017.  She subsequently underwent placement of a Medtronic dual-chamber pacemaker.   Neuro/Psych PSYCHIATRIC DISORDERS Dementia Late onset Alzheimer dementiaLumbar stenosis negative neurological ROS     GI/Hepatic Neg liver ROS, hiatal hernia, GERD  Controlled and Medicated,  Endo/Other  diabetes, Oral Hypoglycemic AgentsHypothyroidism   Renal/GU Renal InsufficiencyRenal disease     Musculoskeletal  (+) Arthritis ,   Abdominal   Peds  Hematology negative hematology ROS (+)   Anesthesia Other Findings Past Medical History: No date: Anemia 01/2009: Angioedema No date: Arthritis No date: Asthma 07/14/2017: Bradycardia     Comment:  a.) symptomatic; s/p Medtroinc dual chamber PPM               placement No date: CKD (chronic kidney disease), stage III (HCC) No date: Complication of anesthesia     Comment:  a.) delayed emergence No date: Edema No date: GERD (gastroesophageal reflux disease) No date: Hiatal hernia No date: High  cholesterol No date: Hypertension No date: Hypothyroidism     Comment:  a.) s/p thyroidectomy No date: Late onset Alzheimer dementia (HCC)     Comment:  a.) on donepezil 05/11/2015: LBBB (left bundle branch block) No date: Lumbar stenosis with neurogenic claudication 07/14/2017: Presence of biventricular cardiac pacemaker     Comment:  a.) Medtronic dual chamber PPM (serial #: [id number]) 07/14/2017: SSS (sick sinus syndrome) (HCC)     Comment:  a.) s/p Medtroinc dual chamber PPM placement No date: T2DM (type 2 diabetes mellitus) (HCC)  Past Surgical History: 1983: ABDOMINAL HYSTERECTOMY No date: APPENDECTOMY 1965: BREAST BIOPSY; Right     Comment:  cyst removed No date: CHOLECYSTECTOMY 03/2012: FOOT FUSION; Right No date: HYSTERECTOMY ABDOMINAL WITH SALPINGO-OOPHORECTOMY; N/A 03/12/2021: JOINT REPLACEMENT; Right     Comment:  in [place] 07/14/2017: PACEMAKER INSERTION; Left     Comment:  Procedure: INSERTION PACEMAKER-DUAL CHAMBER;  Surgeon:               Marcina Millard, MD;  Location: [facility] ORS;  Service:               Cardiovascular;  Laterality: Left; 1970: THYROIDECTOMY; Bilateral No date: TONSILLECTOMY; Bilateral 2007: UMBILICAL HERNIA REPAIR; N/A     Comment:  Procedure: UMBILICAL HERNIA REPAIR; Location: [facility];               Surgeon: Renda Rolls, MD  BMI    Body Mass Index: 27.87 kg/m      Reproductive/Obstetrics negative OB ROS  Anesthesia Physical Anesthesia Plan  ASA: 3  Anesthesia Plan: Spinal   Post-op Pain Management: Regional block* and Ofirmev IV (intra-op)*   Induction: Intravenous  PONV Risk Score and Plan: 2  Airway Management Planned: Natural Airway  Additional Equipment:   Intra-op Plan:   Post-operative Plan:   Informed Consent: I have reviewed the patients History and Physical, chart, labs and discussed the procedure including the risks, benefits and alternatives for the  proposed anesthesia with the patient or authorized representative who has indicated his/her understanding and acceptance.     Dental Advisory Given  Plan Discussed with: Anesthesiologist, CRNA and Surgeon  Anesthesia Plan Comments:         Anesthesia Quick Evaluation

## 2022-01-21 NOTE — TOC Initial Note (Signed)
Transition of Care [facility]) - Initial/Assessment Note    Patient Details  Name: Caitlyn Stephenson MRN: [id number] Date of Birth: 04/11/1941  [facility] of [facility]) CM/SW Contact:    Marlowe Sax, RN Phone Number: 01/21/2022, 9:29 AM  Clinical Narrative:                  [facility] is set up for Home health prior to surgery       Patient Goals and CMS Choice        Expected Discharge Plan and Services                                                Prior Living Arrangements/Services                       Activities of Daily Living      Permission Sought/Granted                  Emotional Assessment              Admission diagnosis:  PRIMARY OSTEOARTHRITIS OF RIGHT KNEE. Patient Active Problem List   Diagnosis Date Noted   Gastroesophageal reflux disease with hiatal hernia 01/21/2022   History of asthma 01/21/2022   Hyperlipidemia 01/21/2022   Hypertension 01/21/2022   Hypothyroidism 01/21/2022   Iron deficiency anemia 01/21/2022   Osteoarthritis 01/21/2022   B12 deficiency 08/23/2021   Pacemaker 07/24/2017   Sick sinus syndrome (HCC) 07/14/2017   Primary osteoarthritis of right knee 05/29/2016   CKD (chronic kidney disease) stage 3, GFR 30-59 ml/min (HCC) 05/09/2016   Controlled type 2 diabetes mellitus with complication, without long-term current use of insulin (HCC) 05/09/2016   Bundle branch block, left 05/11/2015   Episodic lightheadedness 05/11/2015   Left lumbar radiculitis 11/09/2013   Lumbar stenosis with neurogenic claudication 11/09/2013   PCP:  Lynnea Ferrier, MD Pharmacy:   [facility] [address]), [place] - [address] Jer[place]nn [place]) [place] [address] Phone: [phone] Fax: [phone]     Social Determinants of Health (SDOH) Interventions    Readmission Risk Interventions     No data to display

## 2022-01-21 NOTE — Anesthesia Postprocedure Evaluation (Signed)
Anesthesia Post Note  Patient: Caitlyn Stephenson  Procedure(s) Performed: COMPUTER ASSISTED TOTAL KNEE ARTHROPLASTY (Right: Knee)  Patient location during evaluation: PACU Anesthesia Type: Spinal Level of consciousness: awake and alert Pain management: pain level controlled Vital Signs Assessment: post-procedure vital signs reviewed and stable Respiratory status: spontaneous breathing, nonlabored ventilation and respiratory function stable Cardiovascular status: blood pressure returned to baseline and stable Postop Assessment: no apparent nausea or vomiting Anesthetic complications: no   No notable events documented.   Last Vitals:  Vitals:   01/21/22 1200 01/21/22 1219  BP: (!) 145/62 (!) 162/65  Pulse: 62 (!) 59  Resp: 16 16  Temp: (!) 36.3 C   SpO2: 99% 100%    Last Pain:  Vitals:   01/21/22 1219  TempSrc:   PainSc: 0-No pain                 Foye Deer

## 2022-01-21 NOTE — Op Note (Signed)
OPERATIVE NOTE  DATE OF SURGERY:  01/21/2022  PATIENT NAME:  Caitlyn Stephenson   DOB: May 16, 1941  MRN: [id number]  PRE-OPERATIVE DIAGNOSIS: Degenerative arthrosis of the right knee, primary  POST-OPERATIVE DIAGNOSIS:  Same  PROCEDURE:  Right total knee arthroplasty using computer-assisted navigation  SURGEON:  Jena Gauss. M.D.  ASSISTANT: Baldwin Jamaica, PA-C (present and scrubbed throughout the case, critical for assistance with exposure, retraction, instrumentation, and closure)  ANESTHESIA: spinal  ESTIMATED BLOOD LOSS: 50 mL  FLUIDS REPLACED: 800 mL of crystalloid  TOURNIQUET TIME: 70 minutes  DRAINS: 2 medium Hemovac drains  SOFT TISSUE RELEASES: Anterior cruciate ligament, posterior cruciate ligament, deep medial collateral ligament, patellofemoral ligament  IMPLANTS UTILIZED: DePuy Attune size 3 posterior stabilized femoral component (cemented), size 3 rotating platform tibial component (cemented), 35 mm medialized dome patella (cemented), and a 6 mm stabilized rotating platform polyethylene insert.  INDICATIONS FOR SURGERY: Caitlyn Stephenson is a 81 y.o. year old female with a long history of progressive knee pain. X-rays demonstrated severe degenerative changes in tricompartmental fashion. The patient had not seen any significant improvement despite conservative nonsurgical intervention. After discussion of the risks and benefits of surgical intervention, the patient expressed understanding of the risks benefits and agree with plans for total knee arthroplasty.   The risks, benefits, and alternatives were discussed at length including but not limited to the risks of infection, bleeding, nerve injury, stiffness, blood clots, the need for revision surgery, cardiopulmonary complications, among others, and they were willing to proceed.  PROCEDURE IN DETAIL: The patient was brought into the operating room and, after adequate spinal anesthesia was achieved, a tourniquet was placed  on the patient's upper thigh. The patient's knee and leg were cleaned and prepped with alcohol and DuraPrep and draped in the usual sterile fashion. A "timeout" was performed as per usual protocol. The lower extremity was exsanguinated using an Esmarch, and the tourniquet was inflated to 300 mmHg. An anterior longitudinal incision was made followed by a standard mid vastus approach. The deep fibers of the medial collateral ligament were elevated in a subperiosteal fashion off of the medial flare of the tibia so as to maintain a continuous soft tissue sleeve. The patella was subluxed laterally and the patellofemoral ligament was incised. Inspection of the knee demonstrated severe degenerative changes with full-thickness loss of articular cartilage. Osteophytes were debrided using a rongeur. Anterior and posterior cruciate ligaments were excised. Two 4.0 mm Schanz pins were inserted in the femur and into the tibia for attachment of the array of trackers used for computer-assisted navigation. Hip center was identified using a circumduction technique. Distal landmarks were mapped using the computer. The distal femur and proximal tibia were mapped using the computer. The distal femoral cutting guide was positioned using computer-assisted navigation so as to achieve a 5 distal valgus cut. The femur was sized and it was felt that a size 3 femoral component was appropriate. A size 3 femoral cutting guide was positioned and the anterior cut was performed and verified using the computer. This was followed by completion of the posterior and chamfer cuts. Femoral cutting guide for the central box was then positioned in the center box cut was performed.  Attention was then directed to the proximal tibia. Medial and lateral menisci were excised. The extramedullary tibial cutting guide was positioned using computer-assisted navigation so as to achieve a 0 varus-valgus alignment and 3 posterior slope. The cut was performed and  verified using the computer. The proximal tibia was  sized and it was felt that a size 3 tibial tray was appropriate. Tibial and femoral trials were inserted followed by insertion of a 6 mm polyethylene insert. This allowed for excellent mediolateral soft tissue balancing both in flexion and in full extension. Finally, the patella was cut and prepared so as to accommodate a 35 mm medialized dome patella. A patella trial was placed and the knee was placed through a range of motion with excellent patellar tracking appreciated. The femoral trial was removed after debridement of posterior osteophytes. The central post-hole for the tibial component was reamed followed by insertion of a keel punch. Tibial trials were then removed. Cut surfaces of bone were irrigated with copious amounts of normal saline using pulsatile lavage and then suctioned dry. Polymethylmethacrylate cement with gentamicin was prepared in the usual fashion using a vacuum mixer. Cement was applied to the cut surface of the proximal tibia as well as along the undersurface of a size 3 rotating platform tibial component. Tibial component was positioned and impacted into place. Excess cement was removed using Personal assistant. Cement was then applied to the cut surfaces of the femur as well as along the posterior flanges of the size 3 femoral component. The femoral component was positioned and impacted into place. Excess cement was removed using Personal assistant. A 6 mm polyethylene trial was inserted and the knee was brought into full extension with steady axial compression applied. Finally, cement was applied to the backside of a 35 mm medialized dome patella and the patellar component was positioned and patellar clamp applied. Excess cement was removed using Personal assistant. After adequate curing of the cement, the tourniquet was deflated after a total tourniquet time of 70 minutes. Hemostasis was achieved using electrocautery. The knee was irrigated with  copious amounts of normal saline using pulsatile lavage followed by 450 ml of Surgiphor and then suctioned dry. 20 mL of 1.3% Exparel and 60 mL of 0.25% Marcaine in 40 mL of normal saline was injected along the posterior capsule, medial and lateral gutters, and along the arthrotomy site. A 6 mm stabilized rotating platform polyethylene insert was inserted and the knee was placed through a range of motion with excellent mediolateral soft tissue balancing appreciated and excellent patellar tracking noted. 2 medium drains were placed in the wound bed and brought out through separate stab incisions. The medial parapatellar portion of the incision was reapproximated using interrupted sutures of #1 Vicryl. Subcutaneous tissue was approximated in layers using first #0 Vicryl followed #2-0 Vicryl. The skin was approximated with skin staples. A sterile dressing was applied.  The patient tolerated the procedure well and was transported to the recovery room in stable condition.    Niccole Witthuhn P. Angie Fava., M.D.

## 2022-01-21 NOTE — H&P (Signed)
The patient has been re-examined, and the chart reviewed, and there have been no interval changes to the documented history and physical.    The risks, benefits, and alternatives have been discussed at length. The patient expressed understanding of the risks benefits and agreed with plans for surgical intervention.  Navi Ewton P. Kaytlen Lightsey, Jr. M.D.    

## 2022-01-21 NOTE — Transfer of Care (Signed)
Immediate Anesthesia Transfer of Care Note  Patient: Caitlyn Stephenson  Procedure(s) Performed: COMPUTER ASSISTED TOTAL KNEE ARTHROPLASTY (Right: Knee)  Patient Location: PACU  Anesthesia Type:Spinal  Level of Consciousness: awake, drowsy and patient cooperative  Airway & Oxygen Therapy: Patient Spontanous Breathing and Patient connected to face mask oxygen  Post-op Assessment: Report given to RN and Post -op Vital signs reviewed and stable  Post vital signs: Reviewed and stable  Last Vitals:  Vitals Value Taken Time  BP 124/59 01/21/22 1045  Temp    Pulse 67 01/21/22 1049  Resp 19 01/21/22 1049  SpO2 99 % 01/21/22 1049  Vitals shown include unvalidated device data.  Last Pain:  Vitals:   01/21/22 1042  TempSrc:   PainSc: Asleep         Complications: No notable events documented.

## 2022-01-21 NOTE — Anesthesia Procedure Notes (Signed)
Spinal  Patient location during procedure: OR Start time: 01/21/2022 7:28 AM End time: 01/21/2022 7:39 AM Reason for block: surgical anesthesia Staffing Performed: anesthesiologist  Anesthesiologist: Foye Deer, MD Resident/CRNA: Henrietta Hoover, CRNA Performed by: Henrietta Hoover, CRNA Authorized by: Foye Deer, MD   Preanesthetic Checklist Completed: patient identified, IV checked, site marked, risks and benefits discussed, surgical consent, monitors and equipment checked, pre-op evaluation and timeout performed Spinal Block Patient position: sitting Prep: DuraPrep Patient monitoring: heart rate, cardiac monitor, continuous pulse ox and blood pressure Approach: midline Location: L3-4 Injection technique: single-shot Needle Needle type: Sprotte  Needle gauge: 24 G Needle length: 9 cm Assessment Sensory level: T4 Events: CSF return Additional Notes Katrinka Blazing CRNA attempted first but unsuccessful.

## 2022-01-21 NOTE — Plan of Care (Signed)
  Problem: Clinical Measurements: Goal: Postoperative complications will be avoided or minimized Outcome: Progressing   Problem: Pain Management: Goal: Pain level will decrease with appropriate interventions Outcome: Progressing   

## 2022-01-21 NOTE — Progress Notes (Signed)
PT Cancellation Note  Patient Details Name: Caitlyn Stephenson MRN: [id number] DOB: [birth year]   Cancelled Treatment:    Reason Eval/Treat Not Completed: Medical issues which prohibited therapy;Patient not medically ready (Chart reviewed, pt noted to have elevated SBP >169mmHg. WIll defer PT evaluation to later date/time.)  4:09 PM, 01/21/22 Rosamaria Lints, PT, DPT Physical Therapist - [facility]  [phone] (ASCOM)     Sanchez Hemmer C 01/21/2022, 4:09 PM

## 2022-01-22 LAB — GLUCOSE, CAPILLARY
Glucose-Capillary: 69 mg/dL — ABNORMAL LOW (ref 70–99)
Glucose-Capillary: 88 mg/dL (ref 70–99)
Glucose-Capillary: 105 mg/dL — ABNORMAL HIGH (ref 70–99)
Glucose-Capillary: 103 mg/dL — ABNORMAL HIGH (ref 70–99)
Glucose-Capillary: 128 mg/dL — ABNORMAL HIGH (ref 70–99)
Glucose-Capillary: 125 mg/dL — ABNORMAL HIGH (ref 70–99)

## 2022-01-22 MED ORDER — SODIUM CHLORIDE 0.9 % IV BOLUS
500.0000 mL | Freq: Once | INTRAVENOUS | Status: AC
  Administered 2022-01-22: 500 mL via INTRAVENOUS

## 2022-01-22 NOTE — Discharge Summary (Addendum)
Physician Discharge Summary  Patient ID: Caitlyn Stephenson MRN: [id number] DOB/AGE: [birth year] 81 y.o.  Admit date: 01/21/2022 Discharge date: 01/24/2022  Admission Diagnoses:  Total knee replacement status [Z96.659]  Surgeries:Procedure(s): Right total knee arthroplasty using computer-assisted navigation   SURGEON:  Jena Gauss. M.D.   ASSISTANT: Baldwin Jamaica, PA-C (present and scrubbed throughout the case, critical for assistance with exposure, retraction, instrumentation, and closure)   ANESTHESIA: spinal   ESTIMATED BLOOD LOSS: 50 mL   FLUIDS REPLACED: 800 mL of crystalloid   TOURNIQUET TIME: 70 minutes   DRAINS: 2 medium Hemovac drains   SOFT TISSUE RELEASES: Anterior cruciate ligament, posterior cruciate ligament, deep medial collateral ligament, patellofemoral ligament   IMPLANTS UTILIZED: DePuy Attune size 3 posterior stabilized femoral component (cemented), size 3 rotating platform tibial component (cemented), 35 mm medialized dome patella (cemented), and a 6 mm stabilized rotating platform polyethylene insert.  Discharge Diagnoses: Patient Active Problem List   Diagnosis Date Noted   Gastroesophageal reflux disease with hiatal hernia 01/21/2022   History of asthma 01/21/2022   Hyperlipidemia 01/21/2022   Hypertension 01/21/2022   Hypothyroidism 01/21/2022   Iron deficiency anemia 01/21/2022   Osteoarthritis 01/21/2022   Total knee replacement status 01/21/2022   B12 deficiency 08/23/2021   Pacemaker 07/24/2017   Sick sinus syndrome (HCC) 07/14/2017   Primary osteoarthritis of right knee 05/29/2016   CKD (chronic kidney disease) stage 3, GFR 30-59 ml/min (HCC) 05/09/2016   Controlled type 2 diabetes mellitus with complication, without long-term current use of insulin (HCC) 05/09/2016   Bundle branch block, left 05/11/2015   Episodic lightheadedness 05/11/2015   Left lumbar radiculitis 11/09/2013   Lumbar stenosis with neurogenic claudication 11/09/2013     Past Medical History:  Diagnosis Date   Anemia    Angioedema 01/2009   Arthritis    Asthma    Bradycardia 07/14/2017   a.) symptomatic; s/p Medtroinc dual chamber PPM placement   CKD (chronic kidney disease), stage III (HCC)    Complication of anesthesia    a.) delayed emergence   Edema    GERD (gastroesophageal reflux disease)    Hiatal hernia    High cholesterol    Hypertension    Hypothyroidism    a.) s/p thyroidectomy   Late onset Alzheimer dementia (HCC)    a.) on donepezil   LBBB (left bundle branch block) 05/11/2015   Lumbar stenosis with neurogenic claudication    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   a.) Medtronic dual chamber PPM (serial #: [id number])   SSS (sick sinus syndrome) (HCC) 07/14/2017   a.) s/p Medtroinc dual chamber PPM placement   T2DM (type 2 diabetes mellitus) (HCC)      Transfusion:    Consultants (if any):   Discharged Condition: Improved  Hospital Course: Caitlyn Stephenson is an 81 y.o. female who was admitted 01/21/2022 with a diagnosis of right knee osteoarthritis and went to the operating room on 01/21/2022 and underwent right total knee arthroplasty. The patient received perioperative antibiotics for prophylaxis (see below). The patient tolerated the procedure well and was transported to PACU in stable condition. After meeting PACU criteria, the patient was subsequently transferred to the Orthopaedics/Rehabilitation unit.   The patient received DVT prophylaxis in the form of early mobilization, Lovenox, Foot Pumps, and SCDs . A sacral pad had been placed and heels were elevated off of the bed with rolled towels in order to protect skin integrity. Foley catheter was discontinued on postoperative day #0. Wound drains were discontinued  on postoperative day #2. The surgical incision was healing well without signs of infection.  Physical therapy was initiated postoperatively for transfers, gait training, and strengthening.  Occupational therapy was initiated for activities of daily living and evaluation for assisted devices. Rehabilitation goals were reviewed in detail with the patient. The patient made steady progress with physical therapy and physical therapy recommended discharge to Skilled nursing facility.   The patient achieved the preliminary goals of this hospitalization and was felt to be medically and orthopaedically appropriate for discharge.  She was given perioperative antibiotics:  Anti-infectives (From admission, onward)    Start     Dose/Rate Route Frequency Ordered Stop   01/21/22 1400  ceFAZolin (ANCEF) IVPB 2g/100 mL premix        2 g 200 mL/hr over 30 Minutes Intravenous Every 6 hours 01/21/22 1219 01/21/22 2022   01/21/22 0630  ceFAZolin (ANCEF) IVPB 2g/100 mL premix        2 g 200 mL/hr over 30 Minutes Intravenous On call to O.R. 01/21/22 0617 01/21/22 0801   01/21/22 0625  ceFAZolin (ANCEF) 2-4 GM/100ML-% IVPB       Note to Pharmacy: Brain Hilts F: cabinet override      01/21/22 0625 01/21/22 2022     .  Recent vital signs:  Vitals:   01/24/22 1227 01/24/22 1227  BP: (!) 102/42   Pulse: 67 69  Resp: 16   Temp: 98.1 F (36.7 C)   SpO2: (!) 73% 100%    Recent laboratory studies:  No results for input(s): "WBC", "HGB", "HCT", "PLT", "K", "CL", "CO2", "BUN", "CREATININE", "GLUCOSE", "CALCIUM", "LABPT", "INR" in the last 72 hours.  Diagnostic Studies: DG Knee Right Port  Result Date: 01/21/2022 CLINICAL DATA:  Status post arthroplasty EXAM: PORTABLE RIGHT KNEE - 1-2 VIEW COMPARISON:  Right knee MRI 09/24/2017 FINDINGS: Postsurgical changes reflecting right knee arthroplasty are seen. Hardware alignment is within expected limits, without evidence of complication. A surgical drain terminates posterior to the patella. IMPRESSION: Status post right knee arthroplasty without evidence of complication. Electronically Signed   By: Lesia Hausen M.D.   On: 01/21/2022 11:38    Discharge  Medications:   Allergies as of 01/24/2022       Reactions   Fish Allergy Other (See Comments)   Wheezing.  All kinds of fish cause SOB and wheezing Specifically oysters and flounder. No problems with iodine topically   Garlic Other (See Comments)   wheezing   Other Other (See Comments)   Lettuce wheezing   Shellfish Allergy Other (See Comments)   wheezing   Celecoxib Other (See Comments)   Dyspepsia   Hydrocodone-acetaminophen Hives   Meloxicam Nausea Only   Phentermine Other (See Comments)   Insomnia   Advil [ibuprofen] Rash   Aleve, ibuprofen,motrin         Medication List     STOP taking these medications    aspirin EC 81 MG tablet       TAKE these medications    acetaminophen 500 MG tablet Commonly known as: TYLENOL Take 500-1,000 mg by mouth every 8 (eight) hours as needed for moderate pain.   BIOFREEZE COOL THE PAIN EX Apply topically.   Biotin 10 MG Caps Take 10 mg by mouth daily.   busPIRone 7.5 MG tablet Commonly known as: BUSPAR Take 7.5 mg by mouth 3 (three) times daily.   calcium carbonate 500 MG chewable tablet Commonly known as: TUMS - dosed in mg elemental calcium Chew 1 tablet by mouth  daily.   diclofenac sodium 1 % Gel Commonly known as: VOLTAREN Apply 2 g topically 4 (four) times daily.   donepezil 5 MG tablet Commonly known as: ARICEPT Take 5 mg by mouth every morning.   enoxaparin 40 MG/0.4ML injection Commonly known as: LOVENOX Inject 0.4 mLs (40 mg total) into the skin daily for 14 days.   escitalopram 10 MG tablet Commonly known as: LEXAPRO Take 10 mg by mouth daily.   glimepiride 1 MG tablet Commonly known as: AMARYL Take 0.5 mg by mouth daily with breakfast.   lansoprazole 15 MG capsule Commonly known as: PREVACID Take 15 mg by mouth daily at 12 noon.   levothyroxine 88 MCG tablet Commonly known as: SYNTHROID Take 88 mcg by mouth daily before breakfast.   levothyroxine 100 MCG tablet Commonly known as:  SYNTHROID Take 100 mcg by mouth daily before breakfast.   lisinopril 5 MG tablet Commonly known as: ZESTRIL Take 10 mg by mouth daily.   lovastatin 20 MG tablet Commonly known as: MEVACOR Take 40 mg by mouth at bedtime.   OVER THE COUNTER MEDICATION Take 250 mg by mouth daily. Keratin otc supplement   oxyCODONE 5 MG immediate release tablet Commonly known as: Oxy IR/ROXICODONE Take 1 tablet (5 mg total) by mouth every 4 (four) hours as needed for severe pain.   traMADol 50 MG tablet Commonly known as: ULTRAM Take 1 tablet (50 mg total) by mouth every 4 (four) hours as needed for moderate pain.   VITAMIN B 12 PO Take by mouth.   VITAMIN D3 PO Take 1,000 Units by mouth daily.               Durable Medical Equipment  (From admission, onward)           Start     Ordered   01/21/22 1220  DME Walker rolling  Once       Question:  Patient needs a walker to treat with the following condition  Answer:  Total knee replacement status   01/21/22 1219   01/21/22 1220  DME Bedside commode  Once       Question:  Patient needs a bedside commode to treat with the following condition  Answer:  Total knee replacement status   01/21/22 1219            Disposition: Skilled nursing facility     Contact information for follow-up providers     Tamala Julian B, PA-C Follow up on 02/05/2022.   Specialty: Orthopedic Surgery Why: at 1:15pm Contact information: [place] [address] [phone]         Dereck Leep, MD Follow up on 03/05/2022.   Specialty: Orthopedic Surgery Why: at 1:30pm Contact information: [address] [facility] [place]  [place] [address] [phone]              Contact information for after-discharge care     [facility] .   Service: Skilled Nursing Contact information: [address] [place], PA-C 01/24/2022, 12:28 PM

## 2022-01-22 NOTE — Progress Notes (Signed)
Physical Therapy Treatment Patient Details Name: Caitlyn Stephenson MRN: [id number] DOB: 03-Jun-1941 Today's Date: 01/22/2022   History of Present Illness Pt is an 81 y.o. female s/p R TKA 01/21/22.  PMH includes pacemaker placement, sick sinus syndrome, asthma, htn, dementia, hiatal hernia, DM, anemia, angioedema, CKD, s/p thyroidectomy, R foot fusion.    PT Comments    Pt received in recliner, agreed to PT. Assessed BP in sitting and standing, pt unable to maintain standing for full 2 minutes due to c/o dizziness and visual changes.  Sitting BP 155/53  HR 60bpm,  BP ~1 minute standing  BP 135/86  HR 72bpm. Pt assisted back to recliner with LE's elevated until symptoms passed. Min Guard given to return to bed with towel roll under distal Left LE. Pt educated again on positioning and strengthening exercises. Nursing and MD notified of episode in standing. Continue PT in am.    Recommendations for follow up therapy are one component of a multi-disciplinary discharge planning process, led by the attending physician.  Recommendations may be updated based on patient status, additional functional criteria and insurance authorization.  Follow Up Recommendations  Skilled nursing-short term rehab (<3 hours/day) Can patient physically be transported by private vehicle: Yes   Assistance Recommended at Discharge Frequent or constant Supervision/Assistance  Patient can return home with the following A little help with walking and/or transfers;A little help with bathing/dressing/bathroom;Assistance with cooking/housework;Assist for transportation;Help with stairs or ramp for entrance   Equipment Recommendations  Rolling walker (2 wheels);BSC/3in1    Recommendations for Other Services       Precautions / Restrictions Precautions Precautions: Fall;Knee Precaution Booklet Issued: Yes (comment) Precaution Comments:  (given at am session) Required Braces or Orthoses: Knee Immobilizer - Right (If unable to  SLR) Restrictions Weight Bearing Restrictions: Yes RLE Weight Bearing: Weight bearing as tolerated     Mobility  Bed Mobility Overal bed mobility: Needs Assistance Bed Mobility: Sit to Supine       Sit to supine: Min guard (R LE)        Transfers Overall transfer level: Needs assistance Equipment used: Rolling walker (2 wheels) Transfers: Sit to/from Stand, Bed to chair/wheelchair/BSC Sit to Stand: Min assist, Min guard           General transfer comment: vc's for UE/LE placement; min assist to stand from bed and CGA to stand from recliner    Ambulation/Gait               General Gait Details: Pt unable to tolerate gait training due to significant lightheadedness after 1 minute of standing   Stairs             Wheelchair Mobility    Modified Rankin (Stroke Patients Only)       Balance                                            Cognition Arousal/Alertness: Awake/alert Behavior During Therapy: WFL for tasks assessed/performed Overall Cognitive Status: Within Functional Limits for tasks assessed                                          Exercises Total Joint Exercises Ankle Circles/Pumps: AROM, Strengthening, Both, 10 reps, Supine Quad Sets: AROM, Strengthening, Both, 10 reps, Supine Heel  Slides: AAROM, Strengthening, Right, 10 reps, Supine Straight Leg Raises: AROM, Strengthening, Right, 5 reps, Supine Goniometric ROM:  (R knee ROM 5-80 in sitting)    General Comments        Pertinent Vitals/Pain Pain Assessment Pain Assessment: 0-10 Pain Score: 2  Pain Location: R knee Pain Descriptors / Indicators: Aching, Tender, Sore Pain Intervention(s): Monitored during session, Ice applied    Home Living Family/patient expects to be discharged to:: Private residence Living Arrangements: Alone Available Help at Discharge: Family;Available PRN/intermittently Type of Home: House Home Access: Stairs to  enter Entrance Stairs-Rails: None Entrance Stairs-Number of Steps: 1 small step to enter   Home Layout: One level Home Equipment: Agricultural consultant (2 wheels);BSC/3in1;Shower seat Additional Comments: Per pt her son is a Visual merchandiser and daughter in law owns a business 5 minutes away and can stop in if needed    Prior Function            PT Goals (current goals can now be found in the care plan section) Acute Rehab PT Goals Patient Stated Goal: to improve pain and mobility PT Goal Formulation: With patient Time For Goal Achievement: 02/05/22 Potential to Achieve Goals: Good    Frequency    BID      PT Plan      Co-evaluation              AM-PAC PT "6 Clicks" Mobility   Outcome Measure  Help needed turning from your back to your side while in a flat bed without using bedrails?: None Help needed moving from lying on your back to sitting on the side of a flat bed without using bedrails?: A Little Help needed moving to and from a bed to a chair (including a wheelchair)?: A Little Help needed standing up from a chair using your arms (e.g., wheelchair or bedside chair)?: A Little Help needed to walk in hospital room?: A Little Help needed climbing 3-5 steps with a railing? : A Lot 6 Click Score: 18    End of Session Equipment Utilized During Treatment: Gait belt Activity Tolerance: Treatment limited secondary to medical complications (Comment) (dizziness with positional changes) Patient left: in bed;with call bell/phone within reach;with bed alarm set;with SCD's reapplied;Other (comment);with family/visitor present (ice) Nurse Communication: Mobility status (Pt's symptoms) PT Visit Diagnosis: Other abnormalities of gait and mobility (R26.89);Muscle weakness (generalized) (M62.81);Pain Pain - Right/Left: Right Pain - part of body: Knee     Time: 0881-1031 PT Time Calculation (min) (ACUTE ONLY): 30 min  Charges:  $Gait Training: 8-22 mins $Therapeutic Exercise: 8-22  mins $Therapeutic Activity: 8-22 mins                    Zadie Cleverly, PTA    Jannet Askew 01/22/2022, 3:59 PM

## 2022-01-22 NOTE — Progress Notes (Signed)
  Subjective: 1 Day Post-Op Procedure(s) (LRB): COMPUTER ASSISTED TOTAL KNEE ARTHROPLASTY (Right) Patient reports pain as well-controlled.   Patient is well, and has had no acute complaints or problems Plan is to go Home after hospital stay. Negative for chest pain and shortness of breath Fever: no Gastrointestinal: negative for nausea and vomiting.    Objective: Vital signs in last 24 hours: Temp:  [97.2 F (36.2 C)-98 F (36.7 C)] 98 F (36.7 C) (07/25 0713) Pulse Rate:  [59-72] 60 (07/25 0713) Resp:  [13-20] 16 (07/25 0713) BP: (118-186)/(51-83) 118/51 (07/25 0713) SpO2:  [96 %-100 %] 98 % (07/25 0713)  Intake/Output from previous day:  Intake/Output Summary (Last 24 hours) at 01/22/2022 0846 Last data filed at 01/22/2022 3244 Gross per 24 hour  Intake 2591.71 ml  Output 1010 ml  Net 1581.71 ml    Intake/Output this shift: No intake/output data recorded.  Labs: No results for input(s): "HGB" in the last 72 hours. No results for input(s): "WBC", "RBC", "HCT", "PLT" in the last 72 hours. No results for input(s): "NA", "K", "CL", "CO2", "BUN", "CREATININE", "GLUCOSE", "CALCIUM" in the last 72 hours. No results for input(s): "LABPT", "INR" in the last 72 hours.   EXAM General - Patient is Alert, Appropriate, and Oriented Extremity - Neurovascular intact Dorsiflexion/Plantar flexion intact Compartment soft Dressing/Incision -Postoperative dressing remains in place., Polar Care in place and working. , Hemovac in place.  Motor Function - intact, moving foot and toes well on exam. Able to perform independent SLR.  Cardiovascular- Regular rate and rhythm, no murmurs/rubs/gallops Respiratory- Lungs clear to auscultation bilaterally Gastrointestinal- soft, nontender, and active bowel sounds   Assessment/Plan: 1 Day Post-Op Procedure(s) (LRB): COMPUTER ASSISTED TOTAL KNEE ARTHROPLASTY (Right) Principal Problem:   Total knee replacement status  Estimated body mass index  is 27.87 kg/m as calculated from the following:   Height as of this encounter: 5\' 2"  (1.575 m).   Weight as of this encounter: 69.1 kg. Advance diet Up with therapy      DVT Prophylaxis - Lovenox, Ted hose, and SCDs Weight-Bearing as tolerated to left leg  , PA-C [facility] 01/22/2022, 8:46 AM

## 2022-01-22 NOTE — Evaluation (Signed)
Physical Therapy Evaluation Patient Details Name: Caitlyn Stephenson MRN: [id number] DOB: [birth year] Today's Date: 01/22/2022  History of Present Illness  Pt is an 81 y.o. female s/p R TKA 01/21/22.  PMH includes pacemaker placement, sick sinus syndrome, asthma, htn, dementia, hiatal hernia, DM, anemia, angioedema, CKD, s/p thyroidectomy, R foot fusion.  Clinical Impression  Prior to surgery, pt was modified independent ambulating with RW; lives alone in 1 level home with 1 small step to enter; has an aide 3x/week.  Currently pt is SBA semi-supine to sitting edge of bed; CGA to min assist with transfers using RW; and CGA to ambulate 60 feet with RW use.  Pt reporting no pain beginning of session at rest but R knee pain progressed to 4-5/10 with activity.  After walking 60 feet (in hallway), pt reporting feeling lightheaded so pt assisted with sitting in chair; pt initially appeared to feel better after sitting down but then pt began to appear lethargic with concern of pt passing out in chair; nursing staff came to assist; pt brought back to room (sitting in chair); initially unable to transfer pt to bed d/t pt feeling very weak ("lifeless" per pt report); BP 148/65 sitting in chair; nursing checked pt's HR, O2, and blood sugar and reported to be [facility]; eventually able to transfer pt back to bed (stand pivot to L side) and assisted with laying back down in bed and repositioned in bed with all needs in place.  Pt's nurse notified MD Hooten regarding above concerns.  Pt would benefit from skilled PT to address noted impairments and functional limitations (see below for any additional details).  Upon hospital discharge, pt would benefit from SNF (although pt would like to discharge home if possible).    Recommendations for follow up therapy are one component of a multi-disciplinary discharge planning process, led by the attending physician.  Recommendations may be updated based on patient status, additional functional  criteria and insurance authorization.  Follow Up Recommendations Skilled nursing-short term rehab (<3 hours/day) Can patient physically be transported by private vehicle: Yes    Assistance Recommended at Discharge Frequent or constant Supervision/Assistance  Patient can return home with the following  A little help with walking and/or transfers;A little help with bathing/dressing/bathroom;Assistance with cooking/housework;Assist for transportation;Help with stairs or ramp for entrance    Equipment Recommendations Rolling walker (2 wheels);BSC/3in1  Recommendations for Other Services       Functional Status Assessment Patient has had a recent decline in their functional status and demonstrates the ability to make significant improvements in function in a reasonable and predictable amount of time.     Precautions / Restrictions Precautions Precautions: Fall;Knee Precaution Booklet Issued: Yes (comment) Required Braces or Orthoses: Knee Immobilizer - Right (if unable to SLR) Restrictions Weight Bearing Restrictions: Yes RLE Weight Bearing: Weight bearing as tolerated      Mobility  Bed Mobility Overal bed mobility: Needs Assistance Bed Mobility: Supine to Sit     Supine to sit: Supervision, HOB elevated     General bed mobility comments: increased effort/time for pt to perform on own    Transfers Overall transfer level: Needs assistance Equipment used: Rolling walker (2 wheels) Transfers: Sit to/from Stand, Bed to chair/wheelchair/BSC Sit to Stand: Min assist, Min guard   Step pivot transfers: Min guard       General transfer comment: vc's for UE/LE placement; min assist to stand from bed and CGA to stand from recliner    Ambulation/Gait Ambulation/Gait assistance: Min guard Gait  Distance (Feet): 60 Feet Assistive device: Rolling walker (2 wheels)   Gait velocity: decreased     General Gait Details: antalgic; decreased stance time R LE; step to progressing to  partial step through gait pattern; limited distance ambulating d/t lightheadedness  Stairs            Wheelchair Mobility    Modified Rankin (Stroke Patients Only)       Balance Overall balance assessment: Needs assistance Sitting-balance support: No upper extremity supported, Feet supported Sitting balance-Leahy Scale: Good Sitting balance - Comments: steady sitting reaching within BOS   Standing balance support: Bilateral upper extremity supported, During functional activity, Reliant on assistive device for balance Standing balance-Leahy Scale: Fair Standing balance comment: steady ambulating with RW; min assist for balance when pt took B hands off walker (after standing from bed) to answer her phone                             Pertinent Vitals/Pain Pain Assessment Pain Assessment: Faces Faces Pain Scale: Hurts little more Pain Location: R knee Pain Descriptors / Indicators: Aching, Tender, Sore Pain Intervention(s): Limited activity within patient's tolerance, Monitored during session, Premedicated before session, Repositioned, Other (comment) (polar care applied)    Home Living Family/patient expects to be discharged to:: Private residence Living Arrangements: Alone Available Help at Discharge: Family;Available PRN/intermittently Type of Home: House Home Access: Stairs to enter Entrance Stairs-Rails: None Entrance Stairs-Number of Steps: 1 small step to enter   Home Layout: One level Home Equipment: Agricultural consultant (2 wheels);BSC/3in1;Shower seat Additional Comments: Pt reports her son is a Visual merchandiser; her daughter in law is able to stop in as needed.    Prior Function Prior Level of Function : Independent/Modified Independent             Mobility Comments: Ambulatory with RW; pt reports no recent falls       Hand Dominance        Extremity/Trunk Assessment   Upper Extremity Assessment Upper Extremity Assessment: Overall WFL for tasks  assessed    Lower Extremity Assessment Lower Extremity Assessment: RLE deficits/detail (L LE WFL) RLE Deficits / Details: able to perform R LE SLR independently; at least 3/5 AROM hip flexion and ankle DF/PF RLE: Unable to fully assess due to pain    Cervical / Trunk Assessment Cervical / Trunk Assessment: Other exceptions Cervical / Trunk Exceptions: forward head/shoulders  Communication   Communication: No difficulties  Cognition Arousal/Alertness: Awake/alert Behavior During Therapy: WFL for tasks assessed/performed Overall Cognitive Status: Within Functional Limits for tasks assessed (Oriented to at least person and place and general situation)                                          General Comments General comments (skin integrity, edema, etc.): hemovac in place.  Nursing cleared pt for participation in physical therapy.  Pt agreeable to PT session.    Exercises Total Joint Exercises Ankle Circles/Pumps: AROM, Strengthening, Both, 10 reps, Supine Quad Sets: AROM, Strengthening, Both, 10 reps, Supine Heel Slides: AAROM, Strengthening, Right, 10 reps, Supine Hip ABduction/ADduction: AROM, Strengthening, Right, 10 reps, Supine Straight Leg Raises: AROM, Strengthening, Right, 10 reps, Supine Goniometric ROM: R knee AROM 5 to grossly 70 degrees (deferred goniometer measurement d/t pt not feeling well end of session)   Assessment/Plan    PT  Assessment Patient needs continued PT services  PT Problem List Decreased strength;Decreased range of motion;Decreased activity tolerance;Decreased balance;Decreased mobility;Decreased knowledge of use of DME;Decreased knowledge of precautions;Pain       PT Treatment Interventions DME instruction;Gait training;Stair training;Functional mobility training;Therapeutic activities;Therapeutic exercise;Balance training;Patient/family education    PT Goals (Current goals can be found in the Care Plan section)  Acute Rehab PT  Goals Patient Stated Goal: to improve pain and mobility PT Goal Formulation: With patient Time For Goal Achievement: 02/05/22 Potential to Achieve Goals: Good    Frequency BID     Co-evaluation               AM-PAC PT "6 Clicks" Mobility  Outcome Measure Help needed turning from your back to your side while in a flat bed without using bedrails?: None Help needed moving from lying on your back to sitting on the side of a flat bed without using bedrails?: A Little Help needed moving to and from a bed to a chair (including a wheelchair)?: A Little Help needed standing up from a chair using your arms (e.g., wheelchair or bedside chair)?: A Little Help needed to walk in hospital room?: A Little Help needed climbing 3-5 steps with a railing? : A Lot 6 Click Score: 18    End of Session Equipment Utilized During Treatment: Gait belt Activity Tolerance: Treatment limited secondary to medical complications (Comment) (nurse present end of session and nurse notified MD Hooten) Patient left: in bed;with call bell/phone within reach;with bed alarm set;with SCD's reapplied;Other (comment) (R heel floating via towel roll; polar care in place) Nurse Communication: Mobility status;Precautions;Weight bearing status;Other (comment) (pt's symptoms) PT Visit Diagnosis: Other abnormalities of gait and mobility (R26.89);Muscle weakness (generalized) (M62.81);Pain Pain - Right/Left: Right Pain - part of body: Knee    Time: 0915-0955 PT Time Calculation (min) (ACUTE ONLY): 40 min   Charges:   PT Evaluation $PT Eval Low Complexity: 1 Low PT Treatments $Gait Training: 8-22 mins $Therapeutic Exercise: 8-22 mins       Hendricks Limes, PT 01/22/22, 12:20 PM

## 2022-01-22 NOTE — Plan of Care (Signed)
  Problem: Education: Goal: Ability to describe self-care measures that may prevent or decrease complications (Diabetes Survival Skills Education) will improve Outcome: Progressing Goal: Individualized Educational Video(s) Outcome: Progressing   Problem: Coping: Goal: Ability to adjust to condition or change in health will improve Outcome: Progressing   Problem: Fluid Volume: Goal: Ability to maintain a balanced intake and output will improve Outcome: Progressing   Problem: Health Behavior/Discharge Planning: Goal: Ability to identify and utilize available resources and services will improve Outcome: Progressing Goal: Ability to manage health-related needs will improve Outcome: Progressing   Problem: Metabolic: Goal: Ability to maintain appropriate glucose levels will improve Outcome: Progressing   Problem: Nutritional: Goal: Maintenance of adequate nutrition will improve Outcome: Progressing Goal: Progress toward achieving an optimal weight will improve Outcome: Progressing   Problem: Skin Integrity: Goal: Risk for impaired skin integrity will decrease Outcome: Progressing   Problem: Tissue Perfusion: Goal: Adequacy of tissue perfusion will improve Outcome: Progressing   Problem: Education: Goal: Knowledge of General Education information will improve Description: Including pain rating scale, medication(s)/side effects and non-pharmacologic comfort measures Outcome: Progressing   Problem: Health Behavior/Discharge Planning: Goal: Ability to manage health-related needs will improve Outcome: Progressing   Problem: Clinical Measurements: Goal: Ability to maintain clinical measurements within normal limits will improve Outcome: Progressing Goal: Will remain free from infection Outcome: Progressing Goal: Diagnostic test results will improve Outcome: Progressing Goal: Respiratory complications will improve Outcome: Progressing Goal: Cardiovascular complication will  be avoided Outcome: Progressing   Problem: Activity: Goal: Risk for activity intolerance will decrease Outcome: Progressing   Problem: Nutrition: Goal: Adequate nutrition will be maintained Outcome: Progressing   Problem: Coping: Goal: Level of anxiety will decrease Outcome: Progressing   Problem: Elimination: Goal: Will not experience complications related to bowel motility Outcome: Progressing Goal: Will not experience complications related to urinary retention Outcome: Progressing   Problem: Pain Managment: Goal: General experience of comfort will improve Outcome: Progressing   Problem: Safety: Goal: Ability to remain free from injury will improve Outcome: Progressing   Problem: Skin Integrity: Goal: Risk for impaired skin integrity will decrease Outcome: Progressing   Problem: Education: Goal: Knowledge of the prescribed therapeutic regimen will improve Outcome: Progressing Goal: Individualized Educational Video(s) Outcome: Progressing   Problem: Activity: Goal: Ability to avoid complications of mobility impairment will improve Outcome: Progressing Goal: Range of joint motion will improve Outcome: Progressing   Problem: Clinical Measurements: Goal: Postoperative complications will be avoided or minimized Outcome: Progressing   Problem: Pain Management: Goal: Pain level will decrease with appropriate interventions Outcome: Progressing   Problem: Skin Integrity: Goal: Will show signs of wound healing Outcome: Progressing   

## 2022-01-22 NOTE — Evaluation (Signed)
Occupational Therapy Evaluation Patient Details Name: Caitlyn Stephenson MRN: [id number] DOB: 10-Mar-1941 Today's Date: 01/22/2022   History of Present Illness Pt is an 81 y.o. female s/p R TKA 01/21/22.  PMH includes pacemaker placement, sick sinus syndrome, asthma, htn, dementia, hiatal hernia, DM, anemia, angioedema, CKD, s/p thyroidectomy, R foot fusion.   Clinical Impression   Patient presenting with decreased Ind in self care, balance, functional mobility/transfers, endurance, and safety awareness. Patient reports being Mod I at baseline with use of RW. Pt has family to assists her with getting groceries and with medications. She endorses being mod I for for ADLs, fixing simple meals, and laundry. Patient currently functioning at min guard - min A for functional mobility and self care tasks. Pt is limited secondary to dizziness with functional mobility. Pt does not have 24/7 assistance at home. Patient will benefit from acute OT to increase overall independence in the areas of ADLs, functional mobility, and safety awareness in order to safely discharge to next venue of care.      Recommendations for follow up therapy are one component of a multi-disciplinary discharge planning process, led by the attending physician.  Recommendations may be updated based on patient status, additional functional criteria and insurance authorization.   Follow Up Recommendations  Skilled nursing-short term rehab (<3 hours/day)    Assistance Recommended at Discharge Intermittent Supervision/Assistance  Patient can return home with the following A little help with walking and/or transfers;A little help with bathing/dressing/bathroom;Assistance with cooking/housework;Direct supervision/assist for medications management;Direct supervision/assist for financial management;Assist for transportation;Help with stairs or ramp for entrance    Functional Status Assessment  Patient has had a recent decline in their functional  status and demonstrates the ability to make significant improvements in function in a reasonable and predictable amount of time.  Equipment Recommendations  None recommended by OT       Precautions / Restrictions Precautions Precautions: Fall;Knee Precaution Booklet Issued: Yes (comment) Required Braces or Orthoses: Knee Immobilizer - Right (if unable to SLR) Restrictions Weight Bearing Restrictions: Yes RLE Weight Bearing: Weight bearing as tolerated      Mobility Bed Mobility Overal bed mobility: Needs Assistance Bed Mobility: Supine to Sit     Supine to sit: HOB elevated, Supervision     General bed mobility comments: increased effort/time for pt to perform on own    Transfers Overall transfer level: Needs assistance Equipment used: Rolling walker (2 wheels) Transfers: Sit to/from Stand, Bed to chair/wheelchair/BSC Sit to Stand: Min assist, Min guard     Step pivot transfers: Min guard            Balance Overall balance assessment: Needs assistance Sitting-balance support: No upper extremity supported, Feet supported Sitting balance-Leahy Scale: Good Sitting balance - Comments: steady sitting reaching within BOS   Standing balance support: Bilateral upper extremity supported, During functional activity, Reliant on assistive device for balance Standing balance-Leahy Scale: Fair Standing balance comment: steady ambulating with RW                           ADL either performed or assessed with clinical judgement   ADL Overall ADL's : Needs assistance/impaired                                       General ADL Comments: min guard - min A for functional transfers this session and likely  min A for LB self care. Pt limited secondary to dizziness on evaluation.     Vision Patient Visual Report: No change from baseline              Pertinent Vitals/Pain Pain Assessment Pain Assessment: Faces Faces Pain Scale: Hurts little  more Pain Location: R knee Pain Descriptors / Indicators: Aching, Tender, Sore Pain Intervention(s): Monitored during session, Premedicated before session, Repositioned, Ice applied     Hand Dominance Right   Extremity/Trunk Assessment Upper Extremity Assessment Upper Extremity Assessment: Overall WFL for tasks assessed   Lower Extremity Assessment Lower Extremity Assessment: Defer to PT evaluation RLE Deficits / Details: able to perform R LE SLR independently; at least 3/5 AROM hip flexion and ankle DF/PF RLE: Unable to fully assess due to pain   Cervical / Trunk Assessment Cervical / Trunk Assessment: Other exceptions Cervical / Trunk Exceptions: forward head/shoulders   Communication Communication Communication: No difficulties   Cognition Arousal/Alertness: Awake/alert Behavior During Therapy: WFL for tasks assessed/performed Overall Cognitive Status: Within Functional Limits for tasks assessed                                       General Comments  hemovac in place            Home Living Family/patient expects to be discharged to:: Private residence Living Arrangements: Alone Available Help at Discharge: Family;Available PRN/intermittently Type of Home: House Home Access: Stairs to enter [employer] of Steps: 1 small step to enter Entrance Stairs-Rails: None Home Layout: One level     Bathroom Shower/Tub: Chief Strategy Officer: Standard     Home Equipment: Agricultural consultant (2 wheels);BSC/3in1;Shower seat   Additional Comments: Per pt her son is a Visual merchandiser and daughter in law owns a business 5 minutes away and can stop in if needed      Prior Functioning/Environment Prior Level of Function : Independent/Modified Independent             Mobility Comments: Ambulatory with RW; pt reports no recent falls ADLs Comments: Pt reports family assists her with medications and getting groceries. She does sink bathing for  safety and reports ind in all other functional tasks.        OT Problem List: Decreased strength;Decreased range of motion;Decreased activity tolerance;Impaired balance (sitting and/or standing);Decreased cognition;Decreased safety awareness;Decreased knowledge of use of DME or AE;Decreased knowledge of precautions      OT Treatment/Interventions: Self-care/ADL training;Therapeutic exercise;Therapeutic activities;Modalities;Balance training;DME and/or AE instruction;Manual therapy;Patient/family education    OT Goals(Current goals can be found in the care plan section) Acute Rehab OT Goals Patient Stated Goal: to go home OT Goal Formulation: With patient Time For Goal Achievement: 02/06/22 Potential to Achieve Goals: Good ADL Goals Pt Will Perform Grooming: with supervision Pt Will Perform Lower Body Dressing: with supervision;sit to/from stand Pt Will Transfer to Toilet: with supervision;bedside commode Pt Will Perform Toileting - Clothing Manipulation and hygiene: with supervision;sit to/from stand  OT Frequency: Min 2X/week       AM-PAC OT "6 Clicks" Daily Activity     Outcome Measure Help from another person eating meals?: None Help from another person taking care of personal grooming?: A Little Help from another person toileting, which includes using toliet, bedpan, or urinal?: A Little Help from another person bathing (including washing, rinsing, drying)?: A Little Help from another person to put on and taking off  regular upper body clothing?: None Help from another person to put on and taking off regular lower body clothing?: A Little 6 Click Score: 20   End of Session Equipment Utilized During Treatment: Rolling walker (2 wheels) Nurse Communication: Mobility status  Activity Tolerance: Other (comment) (limited by dizziness) Patient left: with call bell/phone within reach;in chair;with chair alarm set  OT Visit Diagnosis: Unsteadiness on feet (R26.81);Repeated falls  (R29.6);Muscle weakness (generalized) (M62.81)                Time: 0258-5277 OT Time Calculation (min): 20 min Charges:  OT General Charges $OT Visit: 1 Visit OT Evaluation $OT Eval Moderate Complexity: 1 Mod OT Treatments $Therapeutic Activity: 8-22 mins  Jackquline Denmark, MS, OTR/L , CBIS ascom [phone]  01/22/22, 12:38 PM

## 2022-01-22 NOTE — TOC Progression Note (Addendum)
Transition of Care [facility]) - Progression Note    Patient Details  Name: Caitlyn Stephenson MRN: [id number] Date of Birth: [birth year]  Transition of Care [facility]) CM/SW Contact  Marlowe Sax, RN Phone Number: 01/22/2022, 1:31 PM  Clinical Narrative:    Spoke with the patient, she is not wanting to go to STR and says her son and daughter in law are close by, she does live alone.  She is agreeable to a bedsearch and will talk to her son, doe snot want to commit to going yet  PASSR obtained, FL2 complete Bedsearch sent      Expected Discharge Plan and Services                                                 Social Determinants of Health (SDOH) Interventions    Readmission Risk Interventions     No data to display

## 2022-01-22 NOTE — [place] FL2 (Signed)
[place] MEDICAID FL2 LEVEL OF CARE SCREENING TOOL     IDENTIFICATION  Patient Name: Caitlyn Stephenson Birthdate: December 21, 1940 Sex: female Admission Date (Current Location): 01/21/2022  [facility] and [place] Number:  Chiropodist and Address:  [facility], [address] [place], [place] [address]      Provider Number: [id number]  Attending Physician Name and Address:  Donato Heinz, MD  Relative Name and Phone Number:  Floyce Stakes [phone]    Current Level of Care: Hospital Recommended Level of Care: Skilled Nursing Facility Prior Approval Number:    Date Approved/Denied:   PASRR Number: [id number]  Discharge Plan: SNF    Current Diagnoses: Patient Active Problem List   Diagnosis Date Noted   Gastroesophageal reflux disease with hiatal hernia 01/21/2022   History of asthma 01/21/2022   Hyperlipidemia 01/21/2022   Hypertension 01/21/2022   Hypothyroidism 01/21/2022   Iron deficiency anemia 01/21/2022   Osteoarthritis 01/21/2022   Total knee replacement status 01/21/2022   B12 deficiency 08/23/2021   Pacemaker 07/24/2017   Sick sinus syndrome (HCC) 07/14/2017   Primary osteoarthritis of right knee 05/29/2016   CKD (chronic kidney disease) stage 3, GFR 30-59 ml/min (HCC) 05/09/2016   Controlled type 2 diabetes mellitus with complication, without long-term current use of insulin (HCC) 05/09/2016   Bundle branch block, left 05/11/2015   Episodic lightheadedness 05/11/2015   Left lumbar radiculitis 11/09/2013   Lumbar stenosis with neurogenic claudication 11/09/2013    Orientation RESPIRATION BLADDER Height & Weight     Self, Time, Situation, Place  Normal   Weight: 69.1 kg Height:  5\' 2"  (157.5 cm)  BEHAVIORAL SYMPTOMS/MOOD NEUROLOGICAL BOWEL NUTRITION STATUS      Continent Diet (see dc summary)  AMBULATORY STATUS COMMUNICATION OF NEEDS Skin   Extensive Assist Verbally Normal, Surgical wounds                        Personal Care Assistance Level of Assistance  Bathing, Feeding, Dressing Bathing Assistance: Limited assistance Feeding assistance: Independent Dressing Assistance: Limited assistance     Functional Limitations Info             SPECIAL CARE FACTORS FREQUENCY  PT (By licensed PT), OT (By licensed OT)     PT Frequency: 5 times per week OT Frequency: 5 times per week            Contractures Contractures Info: Not present    Additional Factors Info  Code Status, Allergies Code Status Info: Full code Allergies Info: Fish Allergy, Garlic, Other, Shellfish Allergy, Celecoxib, Hydrocodone-acetaminophen, Meloxicam, Phentermine, Advil (Ibuprofen)           Current Medications (01/22/2022):  This is the current hospital active medication list Current Facility-Administered Medications  Medication Dose Route Frequency Provider Last Rate Last Admin   0.9 %  sodium chloride infusion   Intravenous Continuous Hooten, 01/24/2022, MD 100 mL/hr at 01/21/22 1334 New Bag at 01/21/22 1334   acetaminophen (TYLENOL) tablet 325-650 mg  325-650 mg Oral Q6H PRN Hooten, 01/23/22, MD       alum & mag hydroxide-simeth (MAALOX/MYLANTA) 200-200-20 MG/5ML suspension 30 mL  30 mL Oral Q4H PRN Hooten, 01-07-2001, MD       bisacodyl (DULCOLAX) suppository 10 mg  10 mg Rectal Daily PRN Hooten, Illene Labrador, MD       busPIRone (BUSPAR) tablet 7.5 mg  7.5 mg Oral TID Hooten, Illene Labrador, MD   7.5 mg  at 01/22/22 1055   calcium carbonate (TUMS - dosed in mg elemental calcium) chewable tablet 200 mg of elemental calcium  1 tablet Oral Daily Hooten, Laurice Record, MD   200 mg of elemental calcium at 01/22/22 1055   cholecalciferol (VITAMIN D3) tablet 1,000 Units  1,000 Units Oral Daily Hooten, Laurice Record, MD   1,000 Units at 01/22/22 1055   diphenhydrAMINE (BENADRYL) 12.5 MG/5ML elixir 12.5-25 mg  12.5-25 mg Oral Q4H PRN Hooten, Laurice Record, MD       donepezil (ARICEPT) tablet 5 mg  5 mg Oral BH-q7a Hooten, Laurice Record, MD   5 mg at 01/22/22  0610   enoxaparin (LOVENOX) injection 30 mg  30 mg Subcutaneous Q12H Hooten, Laurice Record, MD   30 mg at 01/22/22 1010   escitalopram (LEXAPRO) tablet 10 mg  10 mg Oral Daily Hooten, Laurice Record, MD   10 mg at 01/22/22 1055   ferrous sulfate tablet 325 mg  325 mg Oral BID WC Hooten, Laurice Record, MD   325 mg at 01/21/22 1647   glimepiride (AMARYL) tablet 0.5 mg  0.5 mg Oral Q breakfast Hooten, Laurice Record, MD       HYDROmorphone (DILAUDID) injection 0.5-1 mg  0.5-1 mg Intravenous Q4H PRN Hooten, Laurice Record, MD       insulin aspart (novoLOG) injection 0-15 Units  0-15 Units Subcutaneous TID WC Hooten, Laurice Record, MD   8 Units at 01/21/22 1647   insulin aspart (novoLOG) injection 0-5 Units  0-5 Units Subcutaneous QHS Dereck Leep, MD   2 Units at 01/21/22 2150   levothyroxine (SYNTHROID) tablet 100 mcg  100 mcg Oral Q0600 Dereck Leep, MD   100 mcg at 01/22/22 0610   lisinopril (ZESTRIL) tablet 10 mg  10 mg Oral Daily Hooten, Laurice Record, MD   10 mg at 01/22/22 1055   magnesium hydroxide (MILK OF MAGNESIA) suspension 30 mL  30 mL Oral Daily Hooten, Laurice Record, MD   30 mL at 01/22/22 1055   menthol-cetylpyridinium (CEPACOL) lozenge 3 mg  1 lozenge Oral PRN Hooten, Laurice Record, MD       Or   phenol (CHLORASEPTIC) mouth spray 1 spray  1 spray Mouth/Throat PRN Hooten, Laurice Record, MD   1 spray at 01/21/22 2006   metoCLOPramide (REGLAN) tablet 10 mg  10 mg Oral TID AC & HS Hooten, Laurice Record, MD   10 mg at 01/22/22 1226   ondansetron (ZOFRAN) tablet 4 mg  4 mg Oral Q6H PRN Hooten, Laurice Record, MD       Or   ondansetron (ZOFRAN) injection 4 mg  4 mg Intravenous Q6H PRN Hooten, Laurice Record, MD       oxyCODONE (Oxy IR/ROXICODONE) immediate release tablet 10 mg  10 mg Oral Q4H PRN Hooten, Laurice Record, MD   10 mg at 01/21/22 2007   oxyCODONE (Oxy IR/ROXICODONE) immediate release tablet 5 mg  5 mg Oral Q4H PRN Hooten, Laurice Record, MD       pantoprazole (PROTONIX) EC tablet 40 mg  40 mg Oral BID Dereck Leep, MD   40 mg at 01/22/22 1055   pravastatin  (PRAVACHOL) tablet 20 mg  20 mg Oral q1800 Hooten, Laurice Record, MD   20 mg at 01/21/22 1647   senna-docusate (Senokot-S) tablet 1 tablet  1 tablet Oral BID Dereck Leep, MD   1 tablet at 01/22/22 1055   sodium phosphate (FLEET) 7-19 GM/118ML enema 1 enema  1 enema Rectal Once PRN  Hooten, Illene Labrador, MD       traMADol Janean Sark) tablet 50-100 mg  50-100 mg Oral Q4H PRN Donato Heinz, MD   100 mg at 01/22/22 0636   vitamin B-12 (CYANOCOBALAMIN) tablet 1,000 mcg  1,000 mcg Oral Daily Donato Heinz, MD   1,000 mcg at 01/22/22 1055     Discharge Medications: Please see discharge summary for a list of discharge medications.  Relevant Imaging Results:  Relevant Lab Results:   Additional Information SS# [id number]  Marlowe Sax, RN

## 2022-01-23 LAB — GLUCOSE, CAPILLARY
Glucose-Capillary: 104 mg/dL — ABNORMAL HIGH (ref 70–99)
Glucose-Capillary: 147 mg/dL — ABNORMAL HIGH (ref 70–99)
Glucose-Capillary: 176 mg/dL — ABNORMAL HIGH (ref 70–99)
Glucose-Capillary: 159 mg/dL — ABNORMAL HIGH (ref 70–99)

## 2022-01-23 MED ORDER — TRAMADOL HCL 50 MG PO TABS: 50.0000 mg | ORAL_TABLET | ORAL | 0 refills | Status: DC | PRN

## 2022-01-23 MED ORDER — ENOXAPARIN SODIUM 40 MG/0.4ML IJ SOSY: 40.0000 mg | PREFILLED_SYRINGE | INTRAMUSCULAR | 0 refills | Status: DC

## 2022-01-23 MED ORDER — OXYCODONE HCL 5 MG PO TABS: 5.0000 mg | ORAL_TABLET | ORAL | 0 refills | Status: DC | PRN

## 2022-01-23 NOTE — TOC Initial Note (Signed)
Transition of Care [facility]) - Initial/Assessment Note    Patient Details  Name: Caitlyn Stephenson MRN: [id number] Date of Birth: Apr 27, 1941  [facility] of [facility]) CM/SW Contact:    Marlowe Sax, RN Phone Number: 01/23/2022, 10:17 AM  Clinical Narrative:                  Spoke with the patient's son, He would like her to transport EMS to [place] once Discharged and Ins is approved, He understands that Insurance may not cover the EMS and is ok with that.       Patient Goals and CMS Choice        Expected Discharge Plan and Services           Expected Discharge Date: 01/23/22                                    Prior Living Arrangements/Services                       Activities of Daily Living Home Assistive Devices/Equipment: Dan Humphreys (specify type) ADL Screening (condition at time of admission) Patient's cognitive ability adequate to safely complete daily activities?: Yes Is the patient deaf or have difficulty hearing?: No Does the patient have difficulty seeing, even when wearing glasses/contacts?: No Does the patient have difficulty concentrating, remembering, or making decisions?: Yes Patient able to express need for assistance with ADLs?: Yes Does the patient have difficulty dressing or bathing?: Yes Independently performs ADLs?: Yes (appropriate for developmental age) Does the patient have difficulty walking or climbing stairs?: Yes Weakness of Legs: Both Weakness of Arms/Hands: None  Permission Sought/Granted                  Emotional Assessment              Admission diagnosis:  Total knee replacement status [Z96.659] Patient Active Problem List   Diagnosis Date Noted   Gastroesophageal reflux disease with hiatal hernia 01/21/2022   History of asthma 01/21/2022   Hyperlipidemia 01/21/2022   Hypertension 01/21/2022   Hypothyroidism 01/21/2022   Iron deficiency anemia 01/21/2022   Osteoarthritis 01/21/2022   Total knee  replacement status 01/21/2022   B12 deficiency 08/23/2021   Pacemaker 07/24/2017   Sick sinus syndrome (HCC) 07/14/2017   Primary osteoarthritis of right knee 05/29/2016   CKD (chronic kidney disease) stage 3, GFR 30-59 ml/min (HCC) 05/09/2016   Controlled type 2 diabetes mellitus with complication, without long-term current use of insulin (HCC) 05/09/2016   Bundle branch block, left 05/11/2015   Episodic lightheadedness 05/11/2015   Left lumbar radiculitis 11/09/2013   Lumbar stenosis with neurogenic claudication 11/09/2013   PCP:  Lynnea Ferrier, MD Pharmacy:   [facility] [address] ([address]), [place] - [address] [place]nn M[place]) [place] [address] Phone: [phone] Fax: [phone]     Social Determinants of Health (SDOH) Interventions    Readmission Risk Interventions     No data to display

## 2022-01-23 NOTE — Progress Notes (Signed)
  Subjective: 2 Days Post-Op Procedure(s) (LRB): COMPUTER ASSISTED TOTAL KNEE ARTHROPLASTY (Right) Patient reports pain as moderate.   Patient is well, and has had no acute complaints or problems Plan is to go Skilled nursing facility after hospital stay. Negative for chest pain and shortness of breath Fever: no Gastrointestinal: negative for nausea and vomiting.  Patient has not had a bowel movement.  Objective: Vital signs in last 24 hours: Temp:  [97.5 F (36.4 C)-98.5 F (36.9 C)] 98.5 F (36.9 C) (07/26 0552) Pulse Rate:  [59-75] 75 (07/26 0552) Resp:  [16-18] 16 (07/26 0552) BP: (136-156)/(50-66) 148/60 (07/26 0552) SpO2:  [98 %-100 %] 98 % (07/26 0552)  Intake/Output from previous day:  Intake/Output Summary (Last 24 hours) at 01/23/2022 0716 Last data filed at 01/23/2022 0600 Gross per 24 hour  Intake 985.49 ml  Output 35 ml  Net 950.49 ml    Intake/Output this shift: No intake/output data recorded.  Labs: No results for input(s): "HGB" in the last 72 hours. No results for input(s): "WBC", "RBC", "HCT", "PLT" in the last 72 hours. No results for input(s): "NA", "K", "CL", "CO2", "BUN", "CREATININE", "GLUCOSE", "CALCIUM" in the last 72 hours. No results for input(s): "LABPT", "INR" in the last 72 hours.   EXAM General - Patient is Alert, Appropriate, and Oriented Extremity - Neurovascular intact Dorsiflexion/Plantar flexion intact Compartment soft Dressing/Incision -Postoperative dressing remains in place., Polar Care in place and working. , Hemovac in place. , Following removal of post-op dressing, dressing saturated with blood Motor Function - intact, moving foot and toes well on exam.  Cardiovascular- Regular rate and rhythm, no murmurs/rubs/gallops Respiratory- Lungs clear to auscultation bilaterally Gastrointestinal- soft, nontender, and active bowel sounds   Assessment/Plan: 2 Days Post-Op Procedure(s) (LRB): COMPUTER ASSISTED TOTAL KNEE ARTHROPLASTY  (Right) Principal Problem:   Total knee replacement status  Estimated body mass index is 27.87 kg/m as calculated from the following:   Height as of this encounter: 5\' 2"  (1.575 m).   Weight as of this encounter: 69.1 kg. Advance diet Up with therapy  Likely d/c to SNF pending insurance approval.  Post-op dressing removed. , Hemovac removed., Mini compression dressing applied. , and Fresh honeycomb dressing applied.   DVT Prophylaxis - Lovenox, Ted hose, and SCDs Weight-Bearing as tolerated to right leg  , PA-C [facility] 01/23/2022, 7:16 AM

## 2022-01-23 NOTE — TOC Progression Note (Signed)
Transition of Care [facility]) - Progression Note    Patient Details  Name: Caitlyn Stephenson MRN: [id number] Date of Birth: [birth year]  [facility]) CM/SW Contact  Marlowe Sax, RN Phone Number: 01/23/2022, 3:36 PM  Clinical Narrative:     Monia Pouch provided the information for a peer to peer   MD is to call [phone] Option 3 deadline is tomorrow at noon, member ID number [id number]  The patient can request an expedited appeal at [phone]      Expected Discharge Plan and Services           Expected Discharge Date: 01/23/22                                     Social Determinants of Health (SDOH) Interventions    Readmission Risk Interventions     No data to display

## 2022-01-23 NOTE — TOC Progression Note (Signed)
Transition of Care [facility]) - Progression Note    Patient Details  Name: Caitlyn Stephenson MRN: [id number] Date of Birth: May 10, 1941  Transition of Care [facility]) CM/SW Contact  Marlowe Sax, RN Phone Number: 01/23/2022, 8:26 AM  Clinical Narrative:    Spoke with The patient and her son, they chose [place] rehab, I notified Delynn Flavin is pending        Expected Discharge Plan and Services           Expected Discharge Date: 01/23/22                                     Social Determinants of Health (SDOH) Interventions    Readmission Risk Interventions     No data to display

## 2022-01-23 NOTE — Progress Notes (Signed)
Physical Therapy Treatment Patient Details Name: Caitlyn Stephenson MRN: [id number] DOB: 1940-10-15 Today's Date: 01/23/2022   History of Present Illness Pt is an 81 y.o. female s/p R TKA 01/21/22.  PMH includes pacemaker placement, sick sinus syndrome, asthma, htn, dementia, hiatal hernia, DM, anemia, angioedema, CKD, s/p thyroidectomy, R foot fusion.    PT Comments    Patient agreeable to PT with encouragement. Patient continues to require assistance to stand with cues needed. Pre-gait activity performed with encouragement for increased weight acceptance on RLE. Standing tolerance self limited to 1 minute due to reported dizziness. Blood pressure monitored throughout, sitting- 144/62 and standing 120/69. She participated with progression of LE therapeutic exercises from the recliner. Recommend to continue PT to maximize independence. Given slow progress with progression of activity and mobility assistance still required, SNF is recommended at discharge.    Recommendations for follow up therapy are one component of a multi-disciplinary discharge planning process, led by the attending physician.  Recommendations may be updated based on patient status, additional functional criteria and insurance authorization.  Follow Up Recommendations  Skilled nursing-short term rehab (<3 hours/day) Can patient physically be transported by private vehicle: Yes   Assistance Recommended at Discharge Frequent or constant Supervision/Assistance  Patient can return home with the following A little help with walking and/or transfers;A little help with bathing/dressing/bathroom;Assistance with cooking/housework;Assist for transportation;Help with stairs or ramp for entrance   Equipment Recommendations  Rolling walker (2 wheels);BSC/3in1    Recommendations for Other Services       Precautions / Restrictions Precautions Precautions: Fall;Knee Restrictions Weight Bearing Restrictions: Yes RLE Weight Bearing:  Weight bearing as tolerated     Mobility  Bed Mobility               General bed mobility comments: not assessed as patient getting up to chair with nursing staff on PT arrival to room.    Transfers Overall transfer level: Needs assistance Equipment used: Rolling walker (2 wheels) Transfers: Sit to/from Stand Sit to Stand: Min assist           General transfer comment: verbal cues for technique. lifting assistance required for standing. multiple standing bouts performed and patient reports feeling dizziness with activity. sitting BP 144/62 and standing BP 120/69.    Ambulation/Gait             Pre-gait activities: patient initially only partially weight bearing on RLE in standing. encouraged increased weight acceptance on RLE in standing in preparation for ambulation. General Gait Details: patient declined to ambulate due to feeling dizzy and reqeusting to return to sitting after ~ 1 minute of standing.   Stairs             Wheelchair Mobility    Modified Rankin (Stroke Patients Only)       Balance Overall balance assessment: Needs assistance Sitting-balance support: Feet supported Sitting balance-Leahy Scale: Good     Standing balance support: Bilateral upper extremity supported, During functional activity, Reliant on assistive device for balance Standing balance-Leahy Scale: Fair Standing balance comment: with external support of rolling walker                            Cognition Arousal/Alertness: Awake/alert (but groggy) Behavior During Therapy: Flat affect Overall Cognitive Status: Within Functional Limits for tasks assessed  Exercises Total Joint Exercises Ankle Circles/Pumps: AAROM, Strengthening, Right, 15 reps, Seated Quad Sets: AROM, Strengthening, 5 reps, Seated Hip ABduction/ADduction: AAROM, Strengthening, Right, 15 reps, Seated Long Arc Quad: AAROM,  Strengthening, Right, 15 reps, Seated Goniometric ROM: R knee 9-69 degrees measured in sitting Other Exercises Other Exercises: home exercise progam placed within reach and encouraged patient to participate as able. cues for exercise technique for strengthening    General Comments        Pertinent Vitals/Pain Pain Assessment Pain Assessment: 0-10 Pain Score: 5  Pain Location: R knee Pain Descriptors / Indicators: Aching, Tender, Sore Pain Intervention(s): Limited activity within patient's tolerance, Repositioned, Monitored during session    Home Living                          Prior Function            PT Goals (current goals can now be found in the care plan section) Acute Rehab PT Goals Patient Stated Goal: to improve pain and mobility PT Goal Formulation: With patient Time For Goal Achievement: 02/05/22 Potential to Achieve Goals: Good Progress towards PT goals: Progressing toward goals    Frequency    BID      PT Plan Current plan remains appropriate    Co-evaluation              AM-PAC PT "6 Clicks" Mobility   Outcome Measure  Help needed turning from your back to your side while in a flat bed without using bedrails?: None Help needed moving from lying on your back to sitting on the side of a flat bed without using bedrails?: A Little Help needed moving to and from a bed to a chair (including a wheelchair)?: A Little Help needed standing up from a chair using your arms (e.g., wheelchair or bedside chair)?: A Little Help needed to walk in hospital room?: A Little Help needed climbing 3-5 steps with a railing? : A Lot 6 Click Score: 18    End of Session Equipment Utilized During Treatment: Gait belt Activity Tolerance: Patient limited by fatigue Patient left: in chair;with call bell/phone within reach;with chair alarm set;with SCD's reapplied (towel roll under right heel, polar care re-applied) Nurse Communication: Mobility status PT  Visit Diagnosis: Other abnormalities of gait and mobility (R26.89);Muscle weakness (generalized) (M62.81);Pain Pain - Right/Left: Right Pain - part of body: Knee     Time: 0855-0920 PT Time Calculation (min) (ACUTE ONLY): 25 min  Charges:  $Therapeutic Exercise: 8-22 mins $Therapeutic Activity: 8-22 mins                     Donna Bernard, PT, MPT    Ina Homes 01/23/2022, 12:02 PM

## 2022-01-23 NOTE — Plan of Care (Signed)
  Problem: Nutritional: Goal: Maintenance of adequate nutrition will improve Outcome: Progressing   Problem: Skin Integrity: Goal: Risk for impaired skin integrity will decrease Outcome: Progressing   Problem: Activity: Goal: Risk for activity intolerance will decrease Outcome: Progressing   Problem: Pain Managment: Goal: General experience of comfort will improve Outcome: Progressing

## 2022-01-23 NOTE — Progress Notes (Signed)
Physical Therapy Treatment Patient Details Name: Caitlyn Stephenson MRN: [id number] DOB: 1940-12-27 Today's Date: 01/23/2022   History of Present Illness Pt is an 81 y.o. female s/p R TKA 01/21/22.  PMH includes pacemaker placement, sick sinus syndrome, asthma, htn, dementia, hiatal hernia, DM, anemia, angioedema, CKD, s/p thyroidectomy, R foot fusion.    PT Comments    Patient was agreeable to second PT session and was still sitting up in the chair from the morning. Slow progress overall. She required increased assistance for transfers. 3 standing bouts performed, requiring increased physical assistance for each standing bout.  Assistance required to maintain standing balance as patient with a posterior lean. The patient was able to take a few steps with assistance for steadying and advancement of rolling walker but unable to progress further due to fatigue with minimal activity and dizziness reported. Blood pressure 126/88, heart rate 79, Sp02 100% on room air after standing.  Recommend to continue PT to maximize independence and decrease caregiver burden. SNF will be required at discharge at this time.    Recommendations for follow up therapy are one component of a multi-disciplinary discharge planning process, led by the attending physician.  Recommendations may be updated based on patient status, additional functional criteria and insurance authorization.  Follow Up Recommendations  Skilled nursing-short term rehab (<3 hours/day) Can patient physically be transported by private vehicle: Yes   Assistance Recommended at Discharge Frequent or constant Supervision/Assistance  Patient can return home with the following A little help with bathing/dressing/bathroom;Assistance with cooking/housework;Assist for transportation;Help with stairs or ramp for entrance;A lot of help with walking and/or transfers   Equipment Recommendations  Rolling walker (2 wheels);BSC/3in1    Recommendations for Other  Services       Precautions / Restrictions Precautions Precautions: Fall;Knee Restrictions Weight Bearing Restrictions: Yes RLE Weight Bearing: Weight bearing as tolerated     Mobility  Bed Mobility Overal bed mobility: Needs Assistance Bed Mobility: Sit to Supine       Sit to supine: Mod assist   General bed mobility comments: assistance for RLE and trunk support to return to bed. verbal cues for sequencing and technique. increased time and effort required    Transfers Overall transfer level: Needs assistance Equipment used: Rolling walker (2 wheels) Transfers: Sit to/from Stand, Bed to chair/wheelchair/BSC Sit to Stand: Mod assist   Step pivot transfers: Max assist       General transfer comment: verbal cues for technique. lifting and lowering assistance required for transfers as well as faciliation for anterior weight shifting. patient needed increased assistance with transfers this afternoon compared to the morning. increased assistance required with each standing bout as well.    Ambulation/Gait Ambulation/Gait assistance: Mod assist Gait Distance (Feet): 2 Feet Assistive device: Rolling walker (2 wheels)   Gait velocity: decreased   Pre-gait activities: patient initially only partially weight bearing on RLE in standing. encouraged increased weight acceptance on RLE in standing in preparation for ambulation. General Gait Details: steadying assistance provided as well as assistance for rolling walker advancement. the patient reports fatgiue and needing to sit after standing less than 2 minutes and is unable to ambualte further at this time. she reports feeling dizzy however is unable to differentiate between fatigue and dizziness when asked. vitals monitored between standing bouts. BP126/88, Sp02 100% on room air, and heart rate 79bpm   Stairs             Wheelchair Mobility    Modified Rankin (Stroke Patients Only)  Balance Overall balance  assessment: Needs assistance Sitting-balance support: Feet supported Sitting balance-Leahy Scale: Good     Standing balance support: Bilateral upper extremity supported, Reliant on assistive device for balance, During functional activity Standing balance-Leahy Scale: Poor Standing balance comment: posterior loss of balance in standing. faciliation for anterior weight shifting provided                            Cognition Arousal/Alertness:  (groggy but awake) Behavior During Therapy: Flat affect Overall Cognitive Status: Within Functional Limits for tasks assessed                                          Exercises Total Joint Exercises Ankle Circles/Pumps: AAROM, Strengthening, Right, 15 reps, Seated Quad Sets: AROM, Strengthening, 5 reps, Seated Hip ABduction/ADduction: AAROM, Strengthening, Right, 15 reps, Seated Long Arc Quad: AAROM, Strengthening, Right, 15 reps, Seated Goniometric ROM: R knee 9-69 degrees measured in sitting Other Exercises Other Exercises: home exercise progam placed within reach and encouraged patient to participate as able. cues for exercise technique for strengthening    General Comments General comments (skin integrity, edema, etc.): patient educated on positioning of RLE in bed and to avoid placing pillow under the knee to promote knee extension      Pertinent Vitals/Pain Pain Assessment Pain Assessment: 0-10 Pain Score: 5  Pain Location: R knee Pain Descriptors / Indicators: Aching, Tender, Sore Pain Intervention(s): Limited activity within patient's tolerance, Monitored during session, Repositioned (polar care re-applied)    Home Living                          Prior Function            PT Goals (current goals can now be found in the care plan section) Acute Rehab PT Goals Patient Stated Goal: to be able to walk PT Goal Formulation: With patient Time For Goal Achievement: 02/05/22 Potential to  Achieve Goals: Good Progress towards PT goals: Progressing toward goals    Frequency    BID      PT Plan Current plan remains appropriate    Co-evaluation              AM-PAC PT "6 Clicks" Mobility   Outcome Measure  Help needed turning from your back to your side while in a flat bed without using bedrails?: None Help needed moving from lying on your back to sitting on the side of a flat bed without using bedrails?: A Little Help needed moving to and from a bed to a chair (including a wheelchair)?: A Lot Help needed standing up from a chair using your arms (e.g., wheelchair or bedside chair)?: A Lot Help needed to walk in hospital room?: A Lot Help needed climbing 3-5 steps with a railing? : A Lot 6 Click Score: 15    End of Session Equipment Utilized During Treatment: Gait belt Activity Tolerance: Patient limited by fatigue Patient left: in bed;with call bell/phone within reach;with bed alarm set;with SCD's reapplied (towel roll under right heel, polar care re-applied) Nurse Communication: Mobility status PT Visit Diagnosis: Other abnormalities of gait and mobility (R26.89);Muscle weakness (generalized) (M62.81);Pain Pain - Right/Left: Right Pain - part of body: Knee     Time: 1415-1441 PT Time Calculation (min) (ACUTE ONLY): 26 min  Charges:  $Therapeutic  Exercise: 8-22 mins $Therapeutic Activity: 8-22 mins                     Donna Bernard, PT, MPT    Ina Homes 01/23/2022, 2:59 PM

## 2022-01-24 LAB — GLUCOSE, CAPILLARY
Glucose-Capillary: 141 mg/dL — ABNORMAL HIGH (ref 70–99)
Glucose-Capillary: 112 mg/dL — ABNORMAL HIGH (ref 70–99)

## 2022-01-24 NOTE — TOC Progression Note (Signed)
Transition of Care [facility]) - Progression Note    Patient Details  Name: Caitlyn Stephenson MRN: [id number] Date of Birth: Jul 21, 1940  [facility]) CM/SW Contact  Marlowe Sax, RN Phone Number: 01/24/2022, 8:40 AM  Clinical Narrative:     Spoke with the son [name] I provided him the number to make expedited appeal [phone] , He will appeal the denial,     Expected Discharge Plan and Services           Expected Discharge Date: 01/23/22                                     Social Determinants of Health (SDOH) Interventions    Readmission Risk Interventions     No data to display

## 2022-01-24 NOTE — TOC Progression Note (Signed)
Transition of Care [facility]) - Progression Note    Patient Details  Name: Caitlyn Stephenson MRN: [id number] Date of Birth: 1941/02/25  Transition of Care [facility]) CM/SW Contact  Marlowe Sax, RN Phone Number: 01/24/2022, 1:43 PM  Clinical Narrative:    Called patient's son [name] and provided room number 502A EMS called and she is on the list for transport, Bedside nurse to call report        Expected Discharge Plan and Services           Expected Discharge Date: 01/23/22                                     Social Determinants of Health (SDOH) Interventions    Readmission Risk Interventions     No data to display

## 2022-01-24 NOTE — Progress Notes (Signed)
Report given to [facility], LPN.

## 2022-01-24 NOTE — Progress Notes (Signed)
  Subjective: 3 Days Post-Op Procedure(s) (LRB): COMPUTER ASSISTED TOTAL KNEE ARTHROPLASTY (Right) Patient reports pain as well-controlled.   Patient is well, and has had no acute complaints or problems Plan is to go Skilled nursing facility after hospital stay. Negative for chest pain and shortness of breath Fever: no Gastrointestinal: negative for nausea and vomiting.  Patient has had a bowel movement.  Objective: Vital signs in last 24 hours: Temp:  [98.1 F (36.7 C)-98.5 F (36.9 C)] 98.2 F (36.8 C) (07/27 0819) Pulse Rate:  [62-110] 110 (07/27 0828) Resp:  [16] 16 (07/27 0819) BP: (106-158)/(45-68) 106/53 (07/27 0828) SpO2:  [91 %-98 %] 91 % (07/27 0828)  Intake/Output from previous day: No intake or output data in the 24 hours ending 01/24/22 1026  Intake/Output this shift: No intake/output data recorded.  Labs: No results for input(s): "HGB" in the last 72 hours. No results for input(s): "WBC", "RBC", "HCT", "PLT" in the last 72 hours. No results for input(s): "NA", "K", "CL", "CO2", "BUN", "CREATININE", "GLUCOSE", "CALCIUM" in the last 72 hours. No results for input(s): "LABPT", "INR" in the last 72 hours.   EXAM General - Patient is Alert, Appropriate, and Oriented Extremity - Neurovascular intact Dorsiflexion/Plantar flexion intact Compartment soft Dressing/Incision -mild to moderate bloody drainage on honeycomb Motor Function - intact, moving foot and toes well on exam.  Cardiovascular- Regular rate and rhythm, no murmurs/rubs/gallops Respiratory- Lungs clear to auscultation bilaterally Gastrointestinal- soft, nontender, and active bowel sounds   Assessment/Plan: 3 Days Post-Op Procedure(s) (LRB): COMPUTER ASSISTED TOTAL KNEE ARTHROPLASTY (Right) Principal Problem:   Total knee replacement status  Estimated body mass index is 27.87 kg/m as calculated from the following:   Height as of this encounter: 5\' 2"  (1.575 m).   Weight as of this encounter:  69.1 kg. Advance diet Up with therapy  Plan to d/c to SNF pending insurance approval. MD to complete peer to peer today.   Fresh honeycomb dressing applied.   DVT Prophylaxis - Lovenox, Ted hose, and SCDs Weight-Bearing as tolerated to right leg  , PA-C [facility] 01/24/2022, 10:26 AM

## 2022-01-24 NOTE — Progress Notes (Signed)
Physical Therapy Treatment Patient Details Name: Caitlyn Stephenson MRN: [id number] DOB: 12-24-1940 Today's Date: 01/24/2022   History of Present Illness Pt is an 81 y.o. female s/p R TKA 01/21/22.  PMH includes pacemaker placement, sick sinus syndrome, asthma, htn, dementia, hiatal hernia, DM, anemia, angioedema, CKD, s/p thyroidectomy, R foot fusion.    PT Comments    Patient alert, agreeable to PT up in chair at start of session. Initially denied pain but with mobility reported 5/10 R knee pain. Sit <> stand from recliner twice, minA. Pt requiring more lifting assistance this PM. She was able to ambulate two bouts (49ft, 67ft) with RW and CGA. Step to pattern, very slow. difficulty with R foot clearance. minimal to no knee flexion noted in RLE. Pt sitting on BSC at end of session with RN in room. The patient would benefit from further skilled PT intervention to continue to progress towards goals. Recommendation remains appropriate.       Recommendations for follow up therapy are one component of a multi-disciplinary discharge planning process, led by the attending physician.  Recommendations may be updated based on patient status, additional functional criteria and insurance authorization.  Follow Up Recommendations  Skilled nursing-short term rehab (<3 hours/day) Can patient physically be transported by private vehicle: Yes   Assistance Recommended at Discharge Frequent or constant Supervision/Assistance  Patient can return home with the following A little help with bathing/dressing/bathroom;Assistance with cooking/housework;Assist for transportation;Help with stairs or ramp for entrance;A lot of help with walking and/or transfers   Equipment Recommendations  Rolling walker (2 wheels);BSC/3in1    Recommendations for Other Services       Precautions / Restrictions Precautions Precautions: Fall;Knee Precaution Booklet Issued: Yes (comment) Restrictions Weight Bearing Restrictions:  Yes RLE Weight Bearing: Weight bearing as tolerated     Mobility  Bed Mobility               General bed mobility comments: pt up in recliner at start of session, on [facility] with nursing at end of session    Transfers Overall transfer level: Needs assistance Equipment used: Rolling walker (2 wheels) Transfers: Sit to/from Stand Sit to Stand: Min assist Stand pivot transfers: Min assist         General transfer comment: more physical assistance this PM for sit <> stand    Ambulation/Gait Ambulation/Gait assistance: Min guard Gait Distance (Feet):  (63ft, and then 70ft) Assistive device: Rolling walker (2 wheels)         General Gait Details: step to pattern, very slow. difficulty with R foot clearance. minimal to no knee flexion noted in RLE   Stairs             Wheelchair Mobility    Modified Rankin (Stroke Patients Only)       Balance Overall balance assessment: Needs assistance Sitting-balance support: Feet supported Sitting balance-Leahy Scale: Good Sitting balance - Comments: steady sitting reaching within BOS   Standing balance support: Bilateral upper extremity supported, Reliant on assistive device for balance, During functional activity Standing balance-Leahy Scale: Poor                              Cognition Arousal/Alertness: Awake/alert Behavior During Therapy: WFL for tasks assessed/performed Overall Cognitive Status: Within Functional Limits for tasks assessed  Exercises Total Joint Exercises Heel Slides: Seated, AAROM, Right, 10 reps Goniometric ROM: 9- 75 degrees    General Comments        Pertinent Vitals/Pain Pain Assessment Pain Assessment: 0-10 Pain Score: 5  Pain Location: R knee Pain Descriptors / Indicators: Aching, Tender, Sore Pain Intervention(s): Limited activity within patient's tolerance, Monitored during session, Repositioned    Home  Living                          Prior Function            PT Goals (current goals can now be found in the care plan section) Progress towards PT goals: Progressing toward goals    Frequency    BID      PT Plan Current plan remains appropriate    Co-evaluation              AM-PAC PT "6 Clicks" Mobility   Outcome Measure  Help needed turning from your back to your side while in a flat bed without using bedrails?: None Help needed moving from lying on your back to sitting on the side of a flat bed without using bedrails?: A Little Help needed moving to and from a bed to a chair (including a wheelchair)?: A Lot Help needed standing up from a chair using your arms (e.g., wheelchair or bedside chair)?: A Lot Help needed to walk in hospital room?: A Lot Help needed climbing 3-5 steps with a railing? : A Lot 6 Click Score: 15    End of Session Equipment Utilized During Treatment: Gait belt Activity Tolerance: Patient limited by fatigue Patient left: in chair;with family/visitor present (Pt sitting on [facility]) Nurse Communication: Mobility status PT Visit Diagnosis: Other abnormalities of gait and mobility (R26.89);Muscle weakness (generalized) (M62.81);Pain Pain - Right/Left: Right Pain - part of body: Knee     Time: 5102-5852 PT Time Calculation (min) (ACUTE ONLY): 15 min  Charges:  $Therapeutic Activity: 8-22 mins                     Olga Coaster PT, DPT 2:27 PM,01/24/22

## 2022-01-24 NOTE — Care Management Important Message (Signed)
Important Message  Patient Details  Name: Caitlyn Stephenson MRN: [id number] Date of Birth: 1941/06/23   Medicare Important Message Given:  Yes     Olegario Messier A [name] 01/24/2022, 11:26 AM

## 2022-01-24 NOTE — Plan of Care (Signed)
  Problem: Education: Goal: Ability to describe self-care measures that may prevent or decrease complications (Diabetes Survival Skills Education) will improve Outcome: Progressing Goal: Individualized Educational Video(s) Outcome: Progressing   Problem: Coping: Goal: Ability to adjust to condition or change in health will improve Outcome: Progressing   Problem: Fluid Volume: Goal: Ability to maintain a balanced intake and output will improve Outcome: Progressing   Problem: Health Behavior/Discharge Planning: Goal: Ability to identify and utilize available resources and services will improve Outcome: Progressing Goal: Ability to manage health-related needs will improve Outcome: Progressing   Problem: Metabolic: Goal: Ability to maintain appropriate glucose levels will improve Outcome: Progressing   Problem: Nutritional: Goal: Maintenance of adequate nutrition will improve Outcome: Progressing Goal: Progress toward achieving an optimal weight will improve Outcome: Progressing   Problem: Skin Integrity: Goal: Risk for impaired skin integrity will decrease Outcome: Progressing   Problem: Tissue Perfusion: Goal: Adequacy of tissue perfusion will improve Outcome: Progressing   Problem: Education: Goal: Knowledge of General Education information will improve Description: Including pain rating scale, medication(s)/side effects and non-pharmacologic comfort measures Outcome: Progressing   Problem: Health Behavior/Discharge Planning: Goal: Ability to manage health-related needs will improve Outcome: Progressing   Problem: Clinical Measurements: Goal: Ability to maintain clinical measurements within normal limits will improve Outcome: Progressing Goal: Will remain free from infection Outcome: Progressing Goal: Diagnostic test results will improve Outcome: Progressing Goal: Respiratory complications will improve Outcome: Progressing Goal: Cardiovascular complication will  be avoided Outcome: Progressing   Problem: Activity: Goal: Risk for activity intolerance will decrease Outcome: Progressing   Problem: Nutrition: Goal: Adequate nutrition will be maintained Outcome: Progressing   Problem: Coping: Goal: Level of anxiety will decrease Outcome: Progressing   Problem: Elimination: Goal: Will not experience complications related to bowel motility Outcome: Progressing Goal: Will not experience complications related to urinary retention Outcome: Progressing   Problem: Pain Managment: Goal: General experience of comfort will improve Outcome: Progressing   Problem: Safety: Goal: Ability to remain free from injury will improve Outcome: Progressing   Problem: Skin Integrity: Goal: Risk for impaired skin integrity will decrease Outcome: Progressing   Problem: Education: Goal: Knowledge of the prescribed therapeutic regimen will improve Outcome: Progressing Goal: Individualized Educational Video(s) Outcome: Progressing   Problem: Activity: Goal: Ability to avoid complications of mobility impairment will improve Outcome: Progressing Goal: Range of joint motion will improve Outcome: Progressing   Problem: Clinical Measurements: Goal: Postoperative complications will be avoided or minimized Outcome: Progressing   Problem: Pain Management: Goal: Pain level will decrease with appropriate interventions Outcome: Progressing   Problem: Skin Integrity: Goal: Will show signs of wound healing Outcome: Progressing   

## 2022-01-24 NOTE — Progress Notes (Signed)
Physical Therapy Treatment Patient Details Name: Caitlyn Stephenson MRN: [id number] DOB: January 29, 1941 Today's Date: 01/24/2022   History of Present Illness Pt is an 81 y.o. female s/p R TKA 01/21/22.  PMH includes pacemaker placement, sick sinus syndrome, asthma, htn, dementia, hiatal hernia, DM, anemia, angioedema, CKD, s/p thyroidectomy, R foot fusion.    PT Comments    Patient alert, agreeable to PT but a bit hesitant due to headache (8/10) and R knee pain (8/10). She did demonstrated improvement in sit <> stand transfers, able to perform with minA, extended time and reliant on UE support. Performed three times, and first last transfer, pivoted to [facility] with minA. She was able to ambulated ~71ft total with min-modA, did still endorse some dizziness and more difficulty noted with retroambulation to return to recliner, modA. Reliant on maxA for pericare after BSC. The patient would benefit from further skilled PT intervention to continue to progress towards goals. Recommendation remains appropriate.      Recommendations for follow up therapy are one component of a multi-disciplinary discharge planning process, led by the attending physician.  Recommendations may be updated based on patient status, additional functional criteria and insurance authorization.  Follow Up Recommendations  Skilled nursing-short term rehab (<3 hours/day) Can patient physically be transported by private vehicle: Yes   Assistance Recommended at Discharge Frequent or constant Supervision/Assistance  Patient can return home with the following A little help with bathing/dressing/bathroom;Assistance with cooking/housework;Assist for transportation;Help with stairs or ramp for entrance;A lot of help with walking and/or transfers   Equipment Recommendations  Rolling walker (2 wheels);BSC/3in1    Recommendations for Other Services       Precautions / Restrictions Precautions Precautions: Fall;Knee Precaution Booklet Issued:  Yes (comment) Restrictions Weight Bearing Restrictions: Yes RLE Weight Bearing: Weight bearing as tolerated     Mobility  Bed Mobility               General bed mobility comments: up in recliner at start/end of session    Transfers Overall transfer level: Needs assistance Equipment used: Rolling walker (2 wheels) Transfers: Sit to/from Stand Sit to Stand: Min assist Stand pivot transfers: Min assist              Ambulation/Gait Ambulation/Gait assistance: Mod assist Gait Distance (Feet):  (51ft total)               Stairs             Wheelchair Mobility    Modified Rankin (Stroke Patients Only)       Balance Overall balance assessment: Needs assistance Sitting-balance support: Feet supported Sitting balance-Leahy Scale: Good Sitting balance - Comments: steady sitting reaching within BOS   Standing balance support: Bilateral upper extremity supported, Reliant on assistive device for balance, During functional activity Standing balance-Leahy Scale: Poor                              Cognition Arousal/Alertness: Awake/alert Behavior During Therapy: WFL for tasks assessed/performed Overall Cognitive Status: Within Functional Limits for tasks assessed                                          Exercises Total Joint Exercises Heel Slides: Seated, AAROM, Right, 10 reps Goniometric ROM: 9- 75 degrees    General Comments        Pertinent  Vitals/Pain Pain Assessment Pain Assessment: 0-10 Pain Score: 8  Pain Location: R knee, headache Pain Descriptors / Indicators: Aching, Tender, Sore Pain Intervention(s): Limited activity within patient's tolerance, Repositioned, Monitored during session, Patient requesting pain meds-RN notified    Home Living                          Prior Function            PT Goals (current goals can now be found in the care plan section) Progress towards PT goals:  Progressing toward goals    Frequency    BID      PT Plan Current plan remains appropriate    Co-evaluation              AM-PAC PT "6 Clicks" Mobility   Outcome Measure  Help needed turning from your back to your side while in a flat bed without using bedrails?: None Help needed moving from lying on your back to sitting on the side of a flat bed without using bedrails?: A Little Help needed moving to and from a bed to a chair (including a wheelchair)?: A Lot Help needed standing up from a chair using your arms (e.g., wheelchair or bedside chair)?: A Lot Help needed to walk in hospital room?: A Lot Help needed climbing 3-5 steps with a railing? : A Lot 6 Click Score: 15    End of Session Equipment Utilized During Treatment: Gait belt Activity Tolerance: Patient limited by fatigue Patient left: in bed;with call bell/phone within reach;with chair alarm set (towel roll under right heel, polar care re-applied) Nurse Communication: Mobility status PT Visit Diagnosis: Other abnormalities of gait and mobility (R26.89);Muscle weakness (generalized) (M62.81);Pain Pain - Right/Left: Right Pain - part of body: Knee     Time: 1010-1030 PT Time Calculation (min) (ACUTE ONLY): 20 min  Charges:  $Therapeutic Activity: 8-22 mins                    Olga Coaster PT, DPT 10:58 AM,01/24/22

## 2022-01-24 NOTE — TOC Progression Note (Signed)
Transition of Care [facility]) - Progression Note    Patient Details  Name: Caitlyn Stephenson MRN: [id number] Date of Birth: 1941/07/01  Transition of Care [facility]) CM/SW Contact  Marlowe Sax, RN Phone Number: 01/24/2022, 1:30 PM  Clinical Narrative:    Monia Pouch approved the SNF stay 5-63/ 8/75 cert number [id number]        Expected Discharge Plan and Services           Expected Discharge Date: 01/23/22                                     Social Determinants of Health (SDOH) Interventions    Readmission Risk Interventions     No data to display

## 2023-04-10 ENCOUNTER — Other Ambulatory Visit: Payer: Self-pay

## 2023-04-10 ENCOUNTER — Observation Stay (HOSPITAL_COMMUNITY)
Admission: EM | Admit: 2023-04-10 | Discharge: 2023-04-11 | Disposition: A | Discharge status: Skilled Nursing Facility | Payer: Medicare Other | Attending: Family Medicine | Admitting: Family Medicine

## 2023-04-10 ENCOUNTER — Encounter (HOSPITAL_COMMUNITY): Payer: Self-pay | Admitting: Emergency Medicine

## 2023-04-10 ENCOUNTER — Emergency Department (HOSPITAL_COMMUNITY): Payer: Medicare Other

## 2023-04-10 DIAGNOSIS — Z66 Do not resuscitate: Secondary | ICD-10-CM | POA: Insufficient documentation

## 2023-04-10 DIAGNOSIS — I129 Hypertensive chronic kidney disease with stage 1 through stage 4 chronic kidney disease, or unspecified chronic kidney disease: Secondary | ICD-10-CM | POA: Diagnosis not present

## 2023-04-10 DIAGNOSIS — F0390 Unspecified dementia without behavioral disturbance: Secondary | ICD-10-CM | POA: Diagnosis not present

## 2023-04-10 DIAGNOSIS — E118 Type 2 diabetes mellitus with unspecified complications: Secondary | ICD-10-CM | POA: Diagnosis present

## 2023-04-10 DIAGNOSIS — D519 Vitamin B12 deficiency anemia, unspecified: Secondary | ICD-10-CM | POA: Insufficient documentation

## 2023-04-10 DIAGNOSIS — E1122 Type 2 diabetes mellitus with diabetic chronic kidney disease: Secondary | ICD-10-CM | POA: Insufficient documentation

## 2023-04-10 DIAGNOSIS — I495 Sick sinus syndrome: Secondary | ICD-10-CM | POA: Diagnosis not present

## 2023-04-10 DIAGNOSIS — R0789 Other chest pain: Principal | ICD-10-CM | POA: Insufficient documentation

## 2023-04-10 DIAGNOSIS — K449 Diaphragmatic hernia without obstruction or gangrene: Secondary | ICD-10-CM

## 2023-04-10 DIAGNOSIS — E785 Hyperlipidemia, unspecified: Secondary | ICD-10-CM | POA: Diagnosis not present

## 2023-04-10 DIAGNOSIS — R1312 Dysphagia, oropharyngeal phase: Secondary | ICD-10-CM | POA: Diagnosis not present

## 2023-04-10 DIAGNOSIS — Z950 Presence of cardiac pacemaker: Secondary | ICD-10-CM | POA: Diagnosis not present

## 2023-04-10 DIAGNOSIS — E538 Deficiency of other specified B group vitamins: Secondary | ICD-10-CM | POA: Diagnosis not present

## 2023-04-10 DIAGNOSIS — E039 Hypothyroidism, unspecified: Secondary | ICD-10-CM | POA: Diagnosis not present

## 2023-04-10 DIAGNOSIS — I447 Left bundle-branch block, unspecified: Secondary | ICD-10-CM | POA: Diagnosis present

## 2023-04-10 DIAGNOSIS — K219 Gastro-esophageal reflux disease without esophagitis: Secondary | ICD-10-CM | POA: Diagnosis not present

## 2023-04-10 DIAGNOSIS — I10 Essential (primary) hypertension: Secondary | ICD-10-CM | POA: Diagnosis present

## 2023-04-10 DIAGNOSIS — Z7901 Long term (current) use of anticoagulants: Secondary | ICD-10-CM | POA: Diagnosis not present

## 2023-04-10 DIAGNOSIS — R079 Chest pain, unspecified: Secondary | ICD-10-CM | POA: Diagnosis not present

## 2023-04-10 DIAGNOSIS — N183 Chronic kidney disease, stage 3 unspecified: Secondary | ICD-10-CM | POA: Diagnosis not present

## 2023-04-10 LAB — CBC
HCT: 40.9 % (ref 36.0–46.0)
Hemoglobin: 12.7 g/dL (ref 12.0–15.0)
MCH: 25 pg — ABNORMAL LOW (ref 26.0–34.0)
MCHC: 31.1 g/dL (ref 30.0–36.0)
MCV: 80.4 fL (ref 80.0–100.0)
Platelets: 295 10*3/uL (ref 150–400)
RBC: 5.09 MIL/uL (ref 3.87–5.11)
RDW: 15.6 % — ABNORMAL HIGH (ref 11.5–15.5)
WBC: 7.1 10*3/uL (ref 4.0–10.5)
nRBC: 0 % (ref 0.0–0.2)

## 2023-04-10 LAB — BASIC METABOLIC PANEL
Anion gap: 10 (ref 5–15)
BUN: 14 mg/dL (ref 8–23)
CO2: 24 mmol/L (ref 22–32)
Calcium: 8.3 mg/dL — ABNORMAL LOW (ref 8.9–10.3)
Chloride: 102 mmol/L (ref 98–111)
Creatinine, Ser: 0.78 mg/dL (ref 0.44–1.00)
GFR, Estimated: >60 mL/min (ref >60)
Glucose, Bld: 113 mg/dL — ABNORMAL HIGH (ref 70–99)
Potassium: 4.2 mmol/L (ref 3.5–5.1)
Sodium: 136 mmol/L (ref 135–145)

## 2023-04-10 LAB — MAGNESIUM: Magnesium: 2 mg/dL (ref 1.7–2.4)

## 2023-04-10 LAB — GLUCOSE, CAPILLARY: Glucose-Capillary: 122 mg/dL — ABNORMAL HIGH (ref 70–99)

## 2023-04-10 LAB — HEMOGLOBIN A1C
Hgb A1c MFr Bld: 6.3 % — ABNORMAL HIGH (ref 4.8–5.6)
Mean Plasma Glucose: 134.11 mg/dL

## 2023-04-10 LAB — TROPONIN I (HIGH SENSITIVITY)
Troponin I (High Sensitivity): 5 ng/L (ref <18)
Troponin I (High Sensitivity): 7 ng/L (ref <18)

## 2023-04-10 MED ORDER — LISINOPRIL 10 MG PO TABS
10.0000 mg | ORAL_TABLET | Freq: Every day | ORAL | Status: DC
  Administered 2023-04-11: 10 mg via ORAL
  Filled 2023-04-10: qty 1

## 2023-04-10 MED ORDER — ESCITALOPRAM OXALATE 10 MG PO TABS
10.0000 mg | ORAL_TABLET | Freq: Every day | ORAL | Status: DC
  Administered 2023-04-11: 10 mg via ORAL
  Filled 2023-04-10: qty 1

## 2023-04-10 MED ORDER — INSULIN ASPART 100 UNIT/ML IJ SOLN: 0.0000 [IU] | Freq: Every day | INTRAMUSCULAR | Status: DC

## 2023-04-10 MED ORDER — INSULIN ASPART 100 UNIT/ML IJ SOLN: 0.0000 [IU] | Freq: Three times a day (TID) | INTRAMUSCULAR | Status: DC

## 2023-04-10 MED ORDER — PANTOPRAZOLE SODIUM 40 MG PO TBEC
40.0000 mg | DELAYED_RELEASE_TABLET | Freq: Every day | ORAL | Status: DC
  Administered 2023-04-11: 40 mg via ORAL
  Filled 2023-04-10: qty 1

## 2023-04-10 MED ORDER — PERMETHRIN 5 % EX CREA
1.0000 | TOPICAL_CREAM | Freq: Once | CUTANEOUS | Status: AC
  Administered 2023-04-10: 1 via TOPICAL
  Filled 2023-04-10: qty 60

## 2023-04-10 MED ORDER — PRAVASTATIN SODIUM 40 MG PO TABS: 40.0000 mg | ORAL_TABLET | Freq: Every day | ORAL | Status: DC

## 2023-04-10 MED ORDER — BUSPIRONE HCL 5 MG PO TABS
7.5000 mg | ORAL_TABLET | Freq: Three times a day (TID) | ORAL | Status: DC
  Administered 2023-04-10 – 2023-04-11 (×2): 7.5 mg via ORAL
  Filled 2023-04-10 (×2): qty 2

## 2023-04-10 MED ORDER — MIRTAZAPINE 15 MG PO TABS
15.0000 mg | ORAL_TABLET | Freq: Every day | ORAL | Status: DC
  Administered 2023-04-10: 15 mg via ORAL
  Filled 2023-04-10: qty 1

## 2023-04-10 MED ORDER — VITAMIN D 25 MCG (1000 UNIT) PO TABS
1000.0000 [IU] | ORAL_TABLET | Freq: Every day | ORAL | Status: DC
  Administered 2023-04-11: 1000 [IU] via ORAL
  Filled 2023-04-10: qty 1

## 2023-04-10 MED ORDER — HEPARIN SODIUM (PORCINE) 5000 UNIT/ML IJ SOLN
5000.0000 [IU] | Freq: Three times a day (TID) | INTRAMUSCULAR | Status: DC
  Administered 2023-04-10 – 2023-04-11 (×2): 5000 [IU] via SUBCUTANEOUS
  Filled 2023-04-10 (×2): qty 1

## 2023-04-10 MED ORDER — ASPIRIN 81 MG PO CHEW
324.0000 mg | CHEWABLE_TABLET | Freq: Once | ORAL | Status: AC
  Administered 2023-04-10: 324 mg via ORAL
  Filled 2023-04-10: qty 4

## 2023-04-10 MED ORDER — LEVOTHYROXINE SODIUM 50 MCG PO TABS: 100.0000 ug | ORAL_TABLET | Freq: Every day | ORAL | Status: DC

## 2023-04-10 MED ORDER — ACETAMINOPHEN 325 MG PO TABS: 650.0000 mg | ORAL_TABLET | ORAL | Status: DC | PRN

## 2023-04-10 MED ORDER — MELATONIN 5 MG PO TABS
5.0000 mg | ORAL_TABLET | Freq: Every day | ORAL | Status: DC
  Filled 2023-04-10: qty 1

## 2023-04-10 MED ORDER — IVERMECTIN 3 MG PO TABS: 12.0000 mg | ORAL_TABLET | ORAL | Status: DC

## 2023-04-10 MED ORDER — ONDANSETRON HCL 4 MG/2ML IJ SOLN: 4.0000 mg | Freq: Four times a day (QID) | INTRAMUSCULAR | Status: DC | PRN

## 2023-04-10 MED ORDER — LEVOTHYROXINE SODIUM 88 MCG PO TABS: 88.0000 ug | ORAL_TABLET | Freq: Every day | ORAL | Status: DC

## 2023-04-10 MED ORDER — MELATONIN 3 MG PO TABS: 6.0000 mg | ORAL_TABLET | Freq: Every day | ORAL | Status: DC

## 2023-04-10 MED ORDER — LEVOTHYROXINE SODIUM 88 MCG PO TABS
188.0000 ug | ORAL_TABLET | Freq: Every day | ORAL | Status: DC
  Administered 2023-04-11: 188 ug via ORAL
  Filled 2023-04-10: qty 1

## 2023-04-10 NOTE — ED Triage Notes (Signed)
Pt bib ems from [facility] and health for complaints of substernal chest pain after eating breakfast. Pt states she has been burping a lot today. Denies nausea ad vomiting.

## 2023-04-10 NOTE — ED Provider Notes (Signed)
[place] EMERGENCY DEPARTMENT AT [facility] Provider Note   CSN: [id number] Arrival date & time: 04/10/23  1440     History  Chief Complaint  Patient presents with   Chest Pain    Caitlyn Stephenson is a 82 y.o. female.  The history is provided by the patient, the EMS personnel and medical records. No language interpreter was used.  Chest Pain    82 year old female with significant history of sick sinus syndrome, left bundle branch block, hypertension, diabetes, GERD, CKD, Alzheimer dementia sent here from [facility] via EMS for complaints of chest pain.  Patient states the reason she is here just because she choke on her food this morning.  States she was eating pizza.  And she choked on it.  States it took her approximately 30 to 45 minutes to cleared.  Afterward her only complaint is feeling fatigued.  She does not endorse any significant chest pain denies any shortness of breath having focal numbness or focal weakness or increased confusion.  She denies having fever or chills.  She mention she also choked on a piece of fruit last night while eating dinner.  Only chest discomfort is to her mid chest after the choking episode.  Home Medications Prior to Admission medications   Medication Sig Start Date End Date Taking? Authorizing Provider  acetaminophen (TYLENOL) 500 MG tablet Take 500-1,000 mg by mouth every 8 (eight) hours as needed for moderate pain.    [provider]  Biotin 10 MG CAPS Take 10 mg by mouth daily. Patient not taking: Reported on 01/10/2022    [provider]  busPIRone (BUSPAR) 7.5 MG tablet Take 7.5 mg by mouth 3 (three) times daily.    [provider]  calcium carbonate (TUMS - DOSED IN MG ELEMENTAL CALCIUM) 500 MG chewable tablet Chew 1 tablet by mouth daily.    [provider]  Cholecalciferol (VITAMIN D3 PO) Take 1,000 Units by mouth daily.    [provider]  Cyanocobalamin (VITAMIN B  12 PO) Take by mouth.    [provider]  diclofenac sodium (VOLTAREN) 1 % GEL Apply 2 g topically 4 (four) times daily. Patient not taking: Reported on 01/10/2022 09/02/17   [provider]  donepezil (ARICEPT) 5 MG tablet Take 5 mg by mouth every morning.    [provider]  enoxaparin (LOVENOX) 40 MG/0.4ML injection Inject 0.4 mLs (40 mg total) into the skin daily for 14 days. 01/23/22 02/06/22  Lasandra Beech B, PA-C  escitalopram (LEXAPRO) 10 MG tablet Take 10 mg by mouth daily. 02/13/18   [provider]  glimepiride (AMARYL) 1 MG tablet Take 0.5 mg by mouth daily with breakfast.    [provider]  lansoprazole (PREVACID) 15 MG capsule Take 15 mg by mouth daily at 12 noon. Patient not taking: Reported on 01/10/2022    [provider]  levothyroxine (SYNTHROID) 100 MCG tablet Take 100 mcg by mouth daily before breakfast.    [provider]  levothyroxine (SYNTHROID) 88 MCG tablet Take 88 mcg by mouth daily before breakfast. Patient not taking: Reported on 01/10/2022    [provider]  lisinopril (PRINIVIL,ZESTRIL) 5 MG tablet Take 10 mg by mouth daily. 07/30/18 01/10/22  [provider]  lovastatin (MEVACOR) 20 MG tablet Take 40 mg by mouth at bedtime.    [provider]  Menthol, Topical Analgesic, (BIOFREEZE COOL THE PAIN EX) Apply topically.    [provider]  OVER  THE COUNTER MEDICATION Take 250 mg by mouth daily. Keratin otc supplement Patient not taking: Reported on 01/10/2022    [provider]  oxyCODONE (OXY IR/ROXICODONE) 5 MG immediate release tablet Take 1 tablet (5 mg total) by mouth every 4 (four) hours as needed for severe pain. 01/23/22   Madelyn Flavors, PA-C  traMADol (ULTRAM) 50 MG tablet Take 1 tablet (50 mg total) by mouth every 4 (four) hours as needed for moderate pain. 01/23/22   Madelyn Flavors, PA-C      Allergies    Fish allergy, Garlic, Other, Shellfish allergy,  Celecoxib, Hydrocodone-acetaminophen, Meloxicam, Phentermine, and Advil [ibuprofen]    Review of Systems   Review of Systems  Cardiovascular:  Positive for chest pain.  All other systems reviewed and are negative.   Physical Exam Updated Vital Signs BP 131/62   Pulse 64   Resp 16   Ht 5\' 2"  (1.575 m)   Wt 68 kg   SpO2 100%   BMI 27.44 kg/m  Physical Exam Vitals and nursing note reviewed.  Constitutional:      General: She is not in acute distress.    Appearance: She is well-developed.  HENT:     Head: Atraumatic.     Mouth/Throat:     Mouth: Mucous membranes are moist.  Eyes:     Conjunctiva/sclera: Conjunctivae normal.  Cardiovascular:     Rate and Rhythm: Rhythm irregular.  Pulmonary:     Effort: Pulmonary effort is normal.     Breath sounds: No wheezing, rhonchi or rales.  Abdominal:     Palpations: Abdomen is soft.     Tenderness: There is no abdominal tenderness.  Musculoskeletal:     Cervical back: Neck supple.     Comments: 5 out of 5 strength to all 4 extremities  Skin:    Findings: No rash.  Neurological:     Mental Status: She is alert and oriented to person, place, and time.     GCS: GCS eye subscore is 4. GCS verbal subscore is 5. GCS motor subscore is 6.     Cranial Nerves: Cranial nerves 2-12 are intact.     Sensory: Sensation is intact.     Motor: Motor function is intact.  Psychiatric:        Mood and Affect: Mood normal.     ED Results / Procedures / Treatments   Labs (all labs ordered are listed, but only abnormal results are displayed) Labs Reviewed  BASIC METABOLIC PANEL - Abnormal; Notable for the following components:      Result Value   Glucose, Bld 113 (*)    Calcium 8.3 (*)    All other components within normal limits  CBC - Abnormal; Notable for the following components:   MCH 25.0 (*)    RDW 15.6 (*)    All other components within normal limits  TROPONIN I (HIGH SENSITIVITY)  TROPONIN I (HIGH SENSITIVITY)    EKG EKG  Interpretation Date/Time:  Thursday April 10 2023 15:47:54 EDT Ventricular Rate:  65 PR Interval:  205 QRS Duration:  138 QT Interval:  504 QTC Calculation: 525 R Axis:   -49  Text Interpretation: Sinus rhythm IVCD, consider atypical RBBB Left ventricular hypertrophy Inferior infarct, old Probable anterior infarct, age indeterminate v paced rhythm with new t wave inversions Confirmed by Meridee Score [phone]) on 04/10/2023 3:50:58 PM  Radiology DG Chest Port 1 View  Result Date: 04/10/2023 CLINICAL DATA:  Chest pain. EXAM: PORTABLE CHEST 1 VIEW  COMPARISON:  September 30, 2018. FINDINGS: The heart size and mediastinal contours are within normal limits. Left-sided pacemaker is unchanged in position. Both lungs are clear. The visualized skeletal structures are unremarkable. IMPRESSION: No active disease. Electronically Signed   By: Lupita Raider M.D.   On: 04/10/2023 17:19    Procedures Procedures    Medications Ordered in ED Medications  aspirin chewable tablet 324 mg (has no administration in time range)    ED Course/ Medical Decision Making/ A&P Clinical Course as of 04/10/23 1734  Thu Apr 10, 2023  7121 81 year old female with atypical chest pain after eating.  EKG showing new T wave inversions.  Initial troponin flat.  Plan is to review with cardiology for further recommendations. [MB]    Clinical Course User Index [MB] Terrilee Files, MD                                 Medical Decision Making Amount and/or Complexity of Data Reviewed Labs: ordered. Radiology: ordered.  Risk OTC drugs. Decision regarding hospitalization.   BP 131/62   Pulse 64   Resp 16   Ht 5\' 2"  (1.575 m)   Wt 64.9 kg   SpO2 100%   BMI 26.16 kg/m   54:11 PM  82 year old female with significant history of sick sinus syndrome, left bundle branch block, hypertension, diabetes, GERD, CKD, Alzheimer dementia sent here from [facility] via EMS for complaints of chest pain.   Patient states the reason she is here just because she choke on her food this morning.  States she was eating pizza.  And she choked on it.  States it took her approximately 30 to 45 minutes to cleared.  Afterward her only complaint is feeling fatigued.  She does not endorse any significant chest pain denies any shortness of breath having focal numbness or focal weakness or increased confusion.  She denies having fever or chills.  She mention she also choked on a piece of fruit last night while eating dinner.  Only chest discomfort is to her mid chest after the choking episode.  On exam patient is resting comfortably appears to be in no acute discomfort.  No evidence of respiratory distress.  Throat exam unremarkable, lungs otherwise clear's, abdomen is soft nontender, she has equal strength throughout and she is without any focal neurodeficit.  No reproducible chest pain.  Vital signs are reassuring.  Suspect her initial chest discomfort was due to choking on her fruit.  Will obtain stroke swallow screen but no obvious stroke symptoms.  Cardiac workup initiated as well.  4:27 PM Appreciate consultation from cardiology, Dr. Jenene Slicker who request for 2nd trop, if negative then medicine admission for CP r/o.    -Labs ordered, independently viewed and interpreted by me.  Labs remarkable for normal initial trop -The patient was maintained on a cardiac monitor.  I personally viewed and interpreted the cardiac monitored which showed an underlying rhythm of: paced rhythm, new T-wave inversions -Imaging independently viewed and interpreted by me and I agree with radiologist's interpretation.  Result remarkable for CXR unremarkable -This patient presents to the ED for concern of chest pain, this involves an extensive number of treatment options, and is a complaint that carries with it a high risk of complications and morbidity.  The differential diagnosis includes GERD, hiatal hernia, gastritis, MI, esophageal  spasm, stroke -Co morbidities that complicate the patient evaluation includes HTN, DM,  CKD, dementia -Treatment includes ASA -Reevaluation of the patient after these medicines showed that the patient improved -PCP office notes or outside notes reviewed -Discussion with specialist cardiologist Dr. Benedict Needy who recommend hospital admission for cardiac work up.  Appreciate consultation from [facility] Dr. Vertell Novak who agrees to admit pt -Escalation to admission/observation considered: patient is agreeable with admission         Final Clinical Impression(s) / ED Diagnoses Final diagnoses:  Chest pain, unspecified type    Rx / DC Orders ED Discharge Orders     None         Fayrene Helper, PA-C 04/10/23 1734    Terrilee Files, MD 04/11/23 1018

## 2023-04-10 NOTE — ED Notes (Addendum)
ED TO INPATIENT HANDOFF REPORT  ED Nurse Name and Phone #: Haze Justin [phone]  S Name/Age/Gender Caitlyn Stephenson 82 y.o. female Room/Bed: APA06/APA06  Code Status   Code Status: Prior  Home/SNF/Other Nursing Home Patient oriented to: self, place, time, and situation Is this baseline? Yes   Triage Complete: Triage complete  Chief Complaint Chest pain [R07.9]  Triage Note Pt bib ems from [facility] for complaints of substernal chest pain after eating breakfast. Pt states she has been burping a lot today. Denies nausea ad vomiting.    Allergies Allergies  Allergen Reactions   Fish Allergy Other (See Comments)    Wheezing.  All kinds of fish cause SOB and wheezing Specifically oysters and flounder. No problems with iodine topically   Garlic Other (See Comments)    wheezing   Other Other (See Comments)    Lettuce wheezing   Shellfish Allergy Other (See Comments)    wheezing   Celecoxib Other (See Comments)    Dyspepsia   Hydrocodone-Acetaminophen Hives   Meloxicam Nausea Only   Phentermine Other (See Comments)    Insomnia   Advil [Ibuprofen] Rash    Aleve, ibuprofen,motrin     Level of Care/Admitting Diagnosis ED Disposition     ED Disposition  Admit   Condition  --   Comment  Hospital Area: [facility] [[id number]]  Level of Care: Telemetry [5]  Covid Evaluation: Asymptomatic - no recent exposure (last 10 days) testing not required  Diagnosis: Chest pain [213086]  Admitting Physician: Chiquita Loth  Attending Physician: Randol Kern, DAWOOD S [[id number]]          B Medical/Surgery History Past Medical History:  Diagnosis Date   Anemia    Angioedema 01/2009   Arthritis    Asthma    Bradycardia 07/14/2017   a.) symptomatic; s/p Medtroinc dual chamber PPM placement   CKD (chronic kidney disease), stage III (HCC)    Complication of anesthesia    a.) delayed emergence   Edema    GERD (gastroesophageal reflux  disease)    Hiatal hernia    High cholesterol    Hypertension    Hypothyroidism    a.) s/p thyroidectomy   Late onset Alzheimer dementia (HCC)    a.) on donepezil   LBBB (left bundle branch block) 05/11/2015   Lumbar stenosis with neurogenic claudication    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   a.) Medtronic dual chamber PPM (serial #: [id number])   SSS (sick sinus syndrome) (HCC) 07/14/2017   a.) s/p Medtroinc dual chamber PPM placement   T2DM (type 2 diabetes mellitus) (HCC)    Past Surgical History:  Procedure Laterality Date   ABDOMINAL HYSTERECTOMY  1983   APPENDECTOMY     BREAST BIOPSY Right 1965   cyst removed   CHOLECYSTECTOMY     FOOT FUSION Right 03/2012   HYSTERECTOMY ABDOMINAL WITH SALPINGO-OOPHORECTOMY N/A    JOINT REPLACEMENT Right 03/12/2021   in [place]   KNEE ARTHROPLASTY Right 01/21/2022   Procedure: COMPUTER ASSISTED TOTAL KNEE ARTHROPLASTY;  Surgeon: Donato Heinz, MD;  Location: [facility] ORS;  Service: Orthopedics;  Laterality: Right;   PACEMAKER INSERTION Left 07/14/2017   Procedure: INSERTION PACEMAKER-DUAL CHAMBER;  Surgeon: Marcina Millard, MD;  Location: [facility] ORS;  Service: Cardiovascular;  Laterality: Left;   THYROIDECTOMY Bilateral 1970   TONSILLECTOMY Bilateral    UMBILICAL HERNIA REPAIR N/A 2007   Procedure: UMBILICAL HERNIA REPAIR; Location: [facility]; Surgeon: Renda Rolls, MD  A IV Location/Drains/Wounds Patient Lines/Drains/Airways Status     Active Line/Drains/Airways     Name Placement date Placement time Site Days   Incision (Closed) 01/21/22 Knee Right 01/21/22  0950  -- 444   Incision (Closed) 01/21/22 Knee Right 01/21/22  1004  -- 444            Intake/Output Last 24 hours No intake or output data in the 24 hours ending 04/10/23 1803  Labs/Imaging Results for orders placed or performed during the hospital encounter of 04/10/23 (from the past 48 hour(s))  Basic metabolic panel     Status: Abnormal    Collection Time: 04/10/23  3:14 PM  Result Value Ref Range   Sodium 136 135 - 145 mmol/L   Potassium 4.2 3.5 - 5.1 mmol/L   Chloride 102 98 - 111 mmol/L   CO2 24 22 - 32 mmol/L   Glucose, Bld 113 (H) 70 - 99 mg/dL    Comment: Glucose reference range applies only to samples taken after fasting for at least 8 hours.   BUN 14 8 - 23 mg/dL   Creatinine, Ser 3.15 0.44 - 1.00 mg/dL   Calcium 8.3 (L) 8.9 - 10.3 mg/dL   GFR, Estimated >40 >08 mL/min    Comment: (NOTE) Calculated using the CKD-EPI Creatinine Equation (2021)    Anion gap 10 5 - 15    Comment: Performed at [facility], [address], [place], [place] [address]  CBC     Status: Abnormal   Collection Time: 04/10/23  3:14 PM  Result Value Ref Range   WBC 7.1 4.0 - 10.5 K/uL   RBC 5.09 3.87 - 5.11 MIL/uL   Hemoglobin 12.7 12.0 - 15.0 g/dL   HCT 50.9 32.6 - 71.2 %   MCV 80.4 80.0 - 100.0 fL   MCH 25.0 (L) 26.0 - 34.0 pg   MCHC 31.1 30.0 - 36.0 g/dL   RDW 45.8 (H) 09.9 - 83.3 %   Platelets 295 150 - 400 K/uL   nRBC 0.0 0.0 - 0.2 %    Comment: Performed at [facility], [address] [place], [place] [address]  Troponin I (High Sensitivity)     Status: None   Collection Time: 04/10/23  3:14 PM  Result Value Ref Range   Troponin I (High Sensitivity) 5 <18 ng/L    Comment: (NOTE) Elevated high sensitivity troponin I (hsTnI) values and significant  changes across serial measurements may suggest ACS but many other  chronic and acute conditions are known to elevate hsTnI results.  Refer to the "Links" section for chest pain algorithms and additional  guidance. Performed at [facility], [address] [place], [place] [address]   Troponin I (High Sensitivity)     Status: None   Collection Time: 04/10/23  5:21 PM  Result Value Ref Range   Troponin I (High Sensitivity) 7 <18 ng/L    Comment: (NOTE) Elevated high sensitivity troponin I (hsTnI) values and significant  changes across serial measurements may suggest ACS but  many other  chronic and acute conditions are known to elevate hsTnI results.  Refer to the "Links" section for chest pain algorithms and additional  guidance. Performed at [facility], [address], [place], [place] [address]    DG Chest Port 1 View  Result Date: 04/10/2023 CLINICAL DATA:  Chest pain. EXAM: PORTABLE CHEST 1 VIEW COMPARISON:  September 30, 2018. FINDINGS: The heart size and mediastinal contours are within normal limits. Left-sided pacemaker is unchanged in  position. Both lungs are clear. The visualized skeletal structures are unremarkable. IMPRESSION: No active disease. Electronically Signed   By: Lupita Raider M.D.   On: 04/10/2023 17:19    Pending Labs Unresulted Labs (From admission, onward)    None       Vitals/Pain Today's Vitals   04/10/23 1457 04/10/23 1500 04/10/23 1616  BP:  131/62   Pulse:  64   Resp:  16   SpO2:  100%   Weight: 64.9 kg  68 kg  Height: 5\' 2"  (1.575 m)  5\' 2"  (1.575 m)  PainSc: 3       Isolation Precautions Contact precautions  Medications Medications  aspirin chewable tablet 324 mg (324 mg Oral Given 04/10/23 1801)    Mobility manual wheelchair        R Recommendations: See Admitting Provider Note  Report given to: [place]

## 2023-04-10 NOTE — H&P (Signed)
TRH H&P   Patient Demographics:    Caitlyn Stephenson, is a 82 y.o. female  MRN: [id number]   DOB - Nov 23, 1940  Admit Date - 04/10/2023  Outpatient Primary MD for the patient is Lynnea Ferrier, MD  Referring MD/NP/PA: Samella Parr  Patient coming from: SNF, [facility]  Chief Complaint  Patient presents with   Chest Pain      HPI:    Caitlyn Stephenson  is a 82 y.o. female, with past medical history of sick sinus syndrome, left bundle branch block, pacemaker, hypertension, diabetes, GERD, CKD, Alzheimer's dementia, wheelchair dependent secondary to significant arthritis with knee replacement in the past, patient was sent from the facility to evaluate for chest pain, apparently patient stated choking her food this morning when she was eating pizza, did take her 30 to 45 minutes to clear it, she started to complain of being fatigued, has concern of chest pain endorsement by staff at the facility, given her dementia she cannot give any specific details about that, for during choking event. -Patient is being actively treated for bees, son reports tomorrow is her last day of isolation.. -In ED patient EKG did show some new T wave inversion, her troponins were negative, ED physician discussed with cardiology who recommended admission for cardiac workup, she received full dose aspirin, she is currently chest pain-free    Review of systems:   A full 10 point Review of Systems was done, except as stated above, all other Review of Systems were negative.  Review of system is not very reliable given her dementia, it was obtained with assistance of son, records and ED staff.   With Past History of the following :    Past Medical History:  Diagnosis Date   Anemia    Angioedema 01/2009   Arthritis    Asthma    Bradycardia 07/14/2017   a.) symptomatic; s/p Medtroinc dual chamber PPM  placement   CKD (chronic kidney disease), stage III (HCC)    Complication of anesthesia    a.) delayed emergence   Edema    GERD (gastroesophageal reflux disease)    Hiatal hernia    High cholesterol    Hypertension    Hypothyroidism    a.) s/p thyroidectomy   Late onset Alzheimer dementia (HCC)    a.) on donepezil   LBBB (left bundle branch block) 05/11/2015   Lumbar stenosis with neurogenic claudication    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   a.) Medtronic dual chamber PPM (serial #: [id number])   SSS (sick sinus syndrome) (HCC) 07/14/2017   a.) s/p Medtroinc dual chamber PPM placement   T2DM (type 2 diabetes mellitus) (HCC)       Past Surgical History:  Procedure Laterality Date   ABDOMINAL HYSTERECTOMY  1983   APPENDECTOMY     BREAST BIOPSY Right 1965   cyst removed   CHOLECYSTECTOMY  FOOT FUSION Right 03/2012   HYSTERECTOMY ABDOMINAL WITH SALPINGO-OOPHORECTOMY N/A    JOINT REPLACEMENT Right 03/12/2021   in [place]   KNEE ARTHROPLASTY Right 01/21/2022   Procedure: COMPUTER ASSISTED TOTAL KNEE ARTHROPLASTY;  Surgeon: Donato Heinz, MD;  Location: [facility];  Service: Orthopedics;  Laterality: Right;   PACEMAKER INSERTION Left 07/14/2017   Procedure: INSERTION PACEMAKER-DUAL CHAMBER;  Surgeon: Marcina Millard, MD;  Location: [facility];  Service: Cardiovascular;  Laterality: Left;   THYROIDECTOMY Bilateral 1970   TONSILLECTOMY Bilateral    UMBILICAL HERNIA REPAIR N/A 2007   Procedure: UMBILICAL HERNIA REPAIR; Location: [facility]; Surgeon: Renda Rolls, MD      Social History:     Social History   Tobacco Use   Smoking status: Never   Smokeless tobacco: Never  Substance Use Topics   Alcohol use: No        Family History :     Family History  Problem Relation Age of Onset   Breast cancer Neg Hx      Home Medications:   Prior to Admission medications   Medication Sig Start Date End Date Taking? Authorizing Provider   acetaminophen (TYLENOL) 500 MG tablet Take 500-1,000 mg by mouth every 8 (eight) hours as needed for moderate pain.   Yes [provider]  busPIRone (BUSPAR) 7.5 MG tablet Take 7.5 mg by mouth 3 (three) times daily.   Yes [provider]  calcium carbonate (TUMS - DOSED IN MG ELEMENTAL CALCIUM) 500 MG chewable tablet Chew 1 tablet by mouth daily.   Yes [provider]  cetirizine (ZYRTEC) 10 MG tablet Take 10 mg by mouth daily.   Yes [provider]  Cholecalciferol (VITAMIN D3 PO) Take 1,000 Units by mouth daily.   Yes [provider]  Cyanocobalamin (VITAMIN B 12 PO) Take 500 mg by mouth daily.   Yes [provider]  diclofenac sodium (VOLTAREN) 1 % GEL Apply 2 g topically 4 (four) times daily. 09/02/17  Yes [provider]  Emollient (EUCERIN DAILY HYDRATION) CREA Apply 1 Application topically in the morning and at bedtime.   Yes [provider]  escitalopram (LEXAPRO) 10 MG tablet Take 10 mg by mouth daily. 02/13/18  Yes [provider]  glimepiride (AMARYL) 1 MG tablet Take 0.5 mg by mouth daily with breakfast.   Yes [provider]  guaiFENesin (ROBITUSSIN) 100 MG/5ML liquid Take 10 mLs by mouth every 4 (four) hours as needed for cough or to loosen phlegm.   Yes [provider]  ivermectin (STROMECTOL) 3 MG TABS tablet Take 12 mg by mouth every 14 (fourteen) days. Give 4 tablets by mouth one time a day for scabies for 1 day x1 dose 03/28/23 and repeat in 2 weeks on 04/10/23  04/11/23 Yes [provider]  lansoprazole (PREVACID) 15 MG capsule Take 15 mg by mouth daily at 12 noon.   Yes [provider]  levothyroxine (SYNTHROID) 100 MCG tablet Take 100 mcg by mouth daily before breakfast.   Yes [provider]  levothyroxine (SYNTHROID) 88 MCG tablet Take 88 mcg by mouth daily before breakfast.   Yes [provider]  lisinopril (PRINIVIL,ZESTRIL) 5 MG tablet Take 10  mg by mouth daily. 07/30/18 04/10/23 Yes [provider]  loperamide (IMODIUM) 2 MG capsule Take 2 mg by mouth every 6 (six) hours as needed for diarrhea or loose stools.   Yes [provider]  lovastatin (MEVACOR) 20 MG tablet Take 40 mg by mouth at bedtime.  Yes [provider]  melatonin 5 MG TABS Take 5 mg by mouth at bedtime.   Yes [provider]  mirtazapine (REMERON) 15 MG tablet Take 15 mg by mouth at bedtime.   Yes [provider]  permethrin (ELIMITE) 5 % cream Apply 1 Application topically once.   Yes [provider]     Allergies:     Allergies  Allergen Reactions   Fish Allergy Other (See Comments)    Wheezing.  All kinds of fish cause SOB and wheezing Specifically oysters and flounder. No problems with iodine topically   Garlic Other (See Comments)    wheezing   Other Other (See Comments)    Lettuce wheezing   Shellfish Allergy Other (See Comments)    wheezing   Celecoxib Other (See Comments)    Dyspepsia   Hydrocodone-Acetaminophen Hives   Meloxicam Nausea Only   Phentermine Other (See Comments)    Insomnia   Advil [Ibuprofen] Rash    Aleve, ibuprofen,motrin      Physical Exam:   Vitals  Blood pressure 131/62, pulse 64, resp. rate 16, height 5\' 2"  (1.575 m), weight 68 kg, SpO2 100%.   1. General Frail female, elderly, pleasant, laying in bed in no apparent distress  2.  Disoriented x 3, awake, alert, pleasant, but she is with dementia, answers are inaccurate.  3. No F.N deficits, ALL C.Nerves Intact, Strength 5/5 all 4 extremities, Sensation intact all 4 extremities, Plantars down going.  4. Ears and Eyes appear Normal, Conjunctivae clear, PERRLA. Moist Oral Mucosa.  5. Supple Neck, No JVD, No cervical lymphadenopathy appriciated, No Carotid Bruits.  6. Symmetrical Chest wall movement, Good air movement bilaterally, CTAB.  7. RRR, No Gallops, Rubs or Murmurs, No Parasternal Heave.  8. Positive  Bowel Sounds, Abdomen Soft, No tenderness, No organomegaly appriciated,No rebound -guarding or rigidity.  9.  No Cyanosis, Normal Skin Turgor, is having skin lesions in lower extremity, and abdomen due to scabies.  10. Good muscle tone,  joints appear normal , no effusions, Normal ROM.    Data Review:    CBC Recent Labs  Lab 04/10/23 1514  WBC 7.1  HGB 12.7  HCT 40.9  PLT 295  MCV 80.4  MCH 25.0*  MCHC 31.1  RDW 15.6*   ------------------------------------------------------------------------------------------------------------------  Chemistries  Recent Labs  Lab 04/10/23 1514  NA 136  K 4.2  CL 102  CO2 24  GLUCOSE 113*  BUN 14  CREATININE 0.78  CALCIUM 8.3*   ------------------------------------------------------------------------------------------------------------------ estimated creatinine clearance is 48.2 mL/min (by C-G formula based on SCr of 0.78 mg/dL). ------------------------------------------------------------------------------------------------------------------ No results for input(s): "TSH", "T4TOTAL", "T3FREE", "THYROIDAB" in the last 72 hours.  Invalid input(s): "FREET3"  Coagulation profile No results for input(s): "INR", "PROTIME" in the last 168 hours. ------------------------------------------------------------------------------------------------------------------- No results for input(s): "DDIMER" in the last 72 hours. -------------------------------------------------------------------------------------------------------------------  Cardiac Enzymes No results for input(s): "CKMB", "TROPONINI", "MYOGLOBIN" in the last 168 hours.  Invalid input(s): "CK" ------------------------------------------------------------------------------------------------------------------ No results found for: "BNP"   ---------------------------------------------------------------------------------------------------------------  Urinalysis    Component  Value Date/Time   COLORURINE YELLOW (A) 01/10/2022 1217   APPEARANCEUR CLEAR (A) 01/10/2022 1217   LABSPEC 1.012 01/10/2022 1217   PHURINE 5.0 01/10/2022 1217   GLUCOSEU NEGATIVE 01/10/2022 1217   HGBUR NEGATIVE 01/10/2022 1217   BILIRUBINUR NEGATIVE 01/10/2022 1217   KETONESUR NEGATIVE 01/10/2022 1217   PROTEINUR NEGATIVE 01/10/2022 1217   NITRITE NEGATIVE 01/10/2022 1217   LEUKOCYTESUR NEGATIVE 01/10/2022 1217    ----------------------------------------------------------------------------------------------------------------  Imaging Results:    DG Chest Port 1 View  Result Date: 04/10/2023 CLINICAL DATA:  Chest pain. EXAM: PORTABLE CHEST 1 VIEW COMPARISON:  September 30, 2018. FINDINGS: The heart size and mediastinal contours are within normal limits. Left-sided pacemaker is unchanged in position. Both lungs are clear. The visualized skeletal structures are unremarkable. IMPRESSION: No active disease. Electronically Signed   By: Lupita Raider M.D.   On: 04/10/2023 17:19    EKG showing rhythm at 65/min, appears to be, QTc of 525, she is having inverted T waves.   Assessment & Plan:    Principal Problem:   Chest pain Active Problems:   Sick sinus syndrome (HCC)   B12 deficiency   Bundle branch block, left   CKD (chronic kidney disease) stage 3, GFR 30-59 ml/min (HCC)   Controlled type 2 diabetes mellitus with complication, without long-term current use of insulin (HCC)   Gastroesophageal reflux disease with hiatal hernia   Hyperlipidemia   Hypertension   Hypothyroidism   Pacemaker   Chest pain -Patient with dementia, cannot pinpoint exactly from her description if it is related to dysphagia, versus cardiac chest pain, but she does have some dynamic EKG changes significant for T wave inversion and flattening, as well with known history of diabetes, hypertension, hyperlipidemia, so she will be admitted for further workup. -Admit under chest pain observation pathway, will cycle  troponins ( initial trop is negative)  received full dose aspirin in ED, she is currently chest pain-free. -Will monitor on telemetry. -Lipid panel, A1c -Will obtain 2D echo. -Will consult cardiology.  Bradycardia/SSS -Status post pacemaker  Scabies -Continue with ivermectin, will give another treatment of permethrin during hospital stay, per son she is supposed to come off isolation tomorrow  Hyperlipidemia -Check lipid panel, continue with home dose statin  Hypothyroidism -Continue with home dose Synthroid   Diabetes mellitus  -will hold oral agent, will keep on insulin sliding scale, will check A1c  GERD -Continue with PPI  Hypertension -Continue with home Toprol, will add as needed hydralazine as well  Frail, deconditioning, wheel chair dependent -consult PT/OT  Prolonged QTc -Patient does appear to be having paced rhythm, with conduction delay as well, so unclear how accurate her reading of QTc of 525, but will monitor closely on telemetry, aim for potassium> 4 and magnesium> 2  History of dementia -Continue with supportive care   DVT Prophylaxis Heparin  AM Labs Ordered, also please review Full Orders  Family Communication: Admission, patients condition and plan of care including tests being ordered have been discussed with the patient and son and daughter-in-law at bedside* who indicate understanding and agree with the plan and Code Status.  Code Status DNR, has DNR and MOST form present from the facility, confirmed by son  Likely DC to back to SNF  Condition GUARDED  Consults called: Cardiology consult requested in epic  Admission status: Observation  Time spent in minutes : 70 minutes   Huey Bienenstock M.D on 04/10/2023 at 6:11 PM   [facility] - Office  [phone]

## 2023-04-11 ENCOUNTER — Observation Stay (HOSPITAL_COMMUNITY): Payer: Medicare Other

## 2023-04-11 ENCOUNTER — Other Ambulatory Visit (HOSPITAL_COMMUNITY): Payer: Medicare Other

## 2023-04-11 DIAGNOSIS — Z794 Long term (current) use of insulin: Secondary | ICD-10-CM

## 2023-04-11 DIAGNOSIS — R079 Chest pain, unspecified: Secondary | ICD-10-CM | POA: Diagnosis not present

## 2023-04-11 DIAGNOSIS — Z950 Presence of cardiac pacemaker: Secondary | ICD-10-CM

## 2023-04-11 DIAGNOSIS — E119 Type 2 diabetes mellitus without complications: Secondary | ICD-10-CM | POA: Diagnosis not present

## 2023-04-11 DIAGNOSIS — R0789 Other chest pain: Secondary | ICD-10-CM | POA: Diagnosis not present

## 2023-04-11 DIAGNOSIS — I10 Essential (primary) hypertension: Secondary | ICD-10-CM | POA: Diagnosis not present

## 2023-04-11 LAB — LIPID PANEL
Cholesterol: 121 mg/dL (ref 0–200)
HDL: 53 mg/dL (ref >40)
LDL Cholesterol: 53 mg/dL (ref 0–99)
Total CHOL/HDL Ratio: 2.3 {ratio}
Triglycerides: 77 mg/dL (ref <150)
VLDL: 15 mg/dL (ref 0–40)

## 2023-04-11 LAB — GLUCOSE, CAPILLARY
Glucose-Capillary: 77 mg/dL (ref 70–99)
Glucose-Capillary: 171 mg/dL — ABNORMAL HIGH (ref 70–99)

## 2023-04-11 MED ORDER — VITAMIN B 12 500 MCG PO TABS: 500.0000 ug | ORAL_TABLET | Freq: Every day | ORAL | Status: AC

## 2023-04-11 NOTE — Discharge Instructions (Signed)
IMPORTANT INFORMATION: PAY CLOSE ATTENTION   PHYSICIAN DISCHARGE INSTRUCTIONS  Follow with Primary care provider  Klein, Bert J III, MD  and other consultants as instructed by your Hospitalist Physician  SEEK MEDICAL CARE OR RETURN TO EMERGENCY ROOM IF SYMPTOMS COME BACK, WORSEN OR NEW PROBLEM DEVELOPS   Please note: You were cared for by a hospitalist during your hospital stay. Every effort will be made to forward records to your primary care provider.  You can request that your primary care provider send for your hospital records if they have not received them.  Once you are discharged, your primary care physician will handle any further medical issues. Please note that NO REFILLS for any discharge medications will be authorized once you are discharged, as it is imperative that you return to your primary care physician (or establish a relationship with a primary care physician if you do not have one) for your post hospital discharge needs so that they can reassess your need for medications and monitor your lab values.  Please get a complete blood count and chemistry panel checked by your Primary MD at your next visit, and again as instructed by your Primary MD.  Get Medicines reviewed and adjusted: Please take all your medications with you for your next visit with your Primary MD  Laboratory/radiological data: Please request your Primary MD to go over all hospital tests and procedure/radiological results at the follow up, please ask your primary care provider to get all Hospital records sent to his/her office.  In some cases, they will be blood work, cultures and biopsy results pending at the time of your discharge. Please request that your primary care provider follow up on these results.  If you are diabetic, please bring your blood sugar readings with you to your follow up appointment with primary care.    Please call and make your follow up appointments as soon as possible.    Also  Note the following: If you experience worsening of your admission symptoms, develop shortness of breath, life threatening emergency, suicidal or homicidal thoughts you must seek medical attention immediately by calling 911 or calling your MD immediately  if symptoms less severe.  You must read complete instructions/literature along with all the possible adverse reactions/side effects for all the Medicines you take and that have been prescribed to you. Take any new Medicines after you have completely understood and accpet all the possible adverse reactions/side effects.   Do not drive when taking Pain medications or sleeping medications (Benzodiazepines)  Do not take more than prescribed Pain, Sleep and Anxiety Medications. It is not advisable to combine anxiety,sleep and pain medications without talking with your primary care practitioner  Special Instructions: If you have smoked or chewed Tobacco  in the last 2 yrs please stop smoking, stop any regular Alcohol  and or any Recreational drug use.  Wear Seat belts while driving.  Do not drive if taking any narcotic, mind altering or controlled substances or recreational drugs or alcohol.       

## 2023-04-11 NOTE — Consult Note (Signed)
Cardiology Consultation   Patient ID: Caitlyn Stephenson MRN: [id number]; DOB: [birth year]  Admit date: 04/10/2023 Date of Consult: 04/11/2023  PCP:  Lynnea Ferrier, MD   [facility] Providers Cardiologist: [facility] - Dr. Darrold Junker  Patient Profile:   Caitlyn Stephenson is a 82 y.o. female with a hx of SSS (s/p Medtronic PPM placement in 07/2017), HTN, HLD, Type 2 DM, LBBB, Stage 3 CKD and dementia who is being seen 04/11/2023 for the evaluation of chest pain and abnormal EKG at the request of Dr. Randol Kern.  History of Present Illness:   [facility] presented to [facility] on 04/10/2023 for evaluation of chest pain. Reported having choked on her food earlier in the morning when she was consuming pizza and took over 30 to 45 minutes to clear this away.  Reported chest pain at that time but denied any pain upon arrival to the ED.   In talking with the patient today, she is aware that she came to the ED yesterday for chest pain but is unable provide specific details surrounding this. Says that she is at a rehab facility and sits in a wheelchair majority of the day.  Denies any recent chest pain or shortness of breath prior to this. No specific orthopnea or PND. Does report her legs are swollen at times. She is also aware that she has a pacemaker in place. Denies any recurrent pain overnight or this morning. Says that she frequently gets choked on her food.  Initial labs showed WBC 7.1, Hgb 12.7, platelets 295, Na+ 136, K+ 4.2 and creatinine 0.78. Magnesium 2.0. Initial and repeat Hs Troponin values negative at 5 and 7. Hemoglobin A1c 6.3. FLP shows total cholesterol 121, triglycerides 77, HDL 53 and LDL 53. CXR with no active disease. EKG shows V-paced, HR 65 with IVCD and diffuse TWI along the inferior and precordial leads. QTc read as 525 ms but corrected at 458 ms.    Past Medical History:  Diagnosis Date   Anemia    Angioedema 01/2009   Arthritis    Asthma     Bradycardia 07/14/2017   a.) symptomatic; s/p Medtroinc dual chamber PPM placement   CKD (chronic kidney disease), stage III (HCC)    Complication of anesthesia    a.) delayed emergence   Edema    GERD (gastroesophageal reflux disease)    Hiatal hernia    High cholesterol    Hypertension    Hypothyroidism    a.) s/p thyroidectomy   Late onset Alzheimer dementia (HCC)    a.) on donepezil   LBBB (left bundle branch block) 05/11/2015   Lumbar stenosis with neurogenic claudication    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   a.) Medtronic dual chamber PPM (serial #: [id number])   SSS (sick sinus syndrome) (HCC) 07/14/2017   a.) s/p Medtroinc dual chamber PPM placement   T2DM (type 2 diabetes mellitus) (HCC)     Past Surgical History:  Procedure Laterality Date   ABDOMINAL HYSTERECTOMY  1983   APPENDECTOMY     BREAST BIOPSY Right 1965   cyst removed   CHOLECYSTECTOMY     FOOT FUSION Right 03/2012   HYSTERECTOMY ABDOMINAL WITH SALPINGO-OOPHORECTOMY N/A    JOINT REPLACEMENT Right 03/12/2021   in [place]   KNEE ARTHROPLASTY Right 01/21/2022   Procedure: COMPUTER ASSISTED TOTAL KNEE ARTHROPLASTY;  Surgeon: Donato Heinz, MD;  Location: [facility] ORS;  Service: Orthopedics;  Laterality: Right;   PACEMAKER INSERTION Left 07/14/2017  Procedure: INSERTION PACEMAKER-DUAL CHAMBER;  Surgeon: Marcina Millard, MD;  Location: [facility] ORS;  Service: Cardiovascular;  Laterality: Left;   THYROIDECTOMY Bilateral 1970   TONSILLECTOMY Bilateral    UMBILICAL HERNIA REPAIR N/A 2007   Procedure: UMBILICAL HERNIA REPAIR; Location: [facility]; Surgeon: Renda Rolls, MD     Home Medications:  Prior to Admission medications   Medication Sig Start Date End Date Taking? Authorizing Provider  acetaminophen (TYLENOL) 500 MG tablet Take 500-1,000 mg by mouth every 8 (eight) hours as needed for moderate pain.   Yes [provider]  busPIRone (BUSPAR) 7.5 MG tablet Take 7.5 mg by mouth 3  (three) times daily.   Yes [provider]  calcium carbonate (TUMS - DOSED IN MG ELEMENTAL CALCIUM) 500 MG chewable tablet Chew 1 tablet by mouth daily.   Yes [provider]  cetirizine (ZYRTEC) 10 MG tablet Take 10 mg by mouth daily.   Yes [provider]  Cholecalciferol (VITAMIN D3 PO) Take 1,000 Units by mouth daily.   Yes [provider]  Cyanocobalamin (VITAMIN B 12 PO) Take 500 mg by mouth daily.   Yes [provider]  diclofenac sodium (VOLTAREN) 1 % GEL Apply 2 g topically 4 (four) times daily. 09/02/17  Yes [provider]  Emollient (EUCERIN DAILY HYDRATION) CREA Apply 1 Application topically in the morning and at bedtime.   Yes [provider]  escitalopram (LEXAPRO) 10 MG tablet Take 10 mg by mouth daily. 02/13/18  Yes [provider]  glimepiride (AMARYL) 1 MG tablet Take 0.5 mg by mouth daily with breakfast.   Yes [provider]  guaiFENesin (ROBITUSSIN) 100 MG/5ML liquid Take 10 mLs by mouth every 4 (four) hours as needed for cough or to loosen phlegm.   Yes [provider]  ivermectin (STROMECTOL) 3 MG TABS tablet Take 12 mg by mouth every 14 (fourteen) days. Give 4 tablets by mouth one time a day for scabies for 1 day x1 dose 03/28/23 and repeat in 2 weeks on 04/10/23  04/11/23 Yes [provider]  lansoprazole (PREVACID) 15 MG capsule Take 15 mg by mouth daily at 12 noon.   Yes [provider]  levothyroxine (SYNTHROID) 100 MCG tablet Take 100 mcg by mouth daily before breakfast.   Yes [provider]  levothyroxine (SYNTHROID) 88 MCG tablet Take 88 mcg by mouth daily before breakfast.   Yes [provider]  lisinopril (PRINIVIL,ZESTRIL) 5 MG tablet Take 10 mg by mouth daily. 07/30/18 04/10/23 Yes [provider]  loperamide (IMODIUM) 2 MG capsule Take 2 mg by mouth every 6 (six) hours as needed for diarrhea or loose stools.   Yes [provider]  lovastatin (MEVACOR) 20 MG tablet Take 40 mg by mouth at bedtime.   Yes [provider]  melatonin 5 MG TABS Take 5 mg by mouth at bedtime.   Yes [provider]  mirtazapine (REMERON) 15 MG tablet Take 15 mg by mouth at bedtime.   Yes [provider]  permethrin (ELIMITE) 5 % cream Apply 1 Application topically once. For scabies   Yes [provider]    Inpatient Medications: Scheduled Meds:  busPIRone  7.5 mg Oral TID   cholecalciferol  1,000 Units Oral Daily   escitalopram  10 mg Oral Daily   heparin  5,000 Units Subcutaneous Q8H   insulin aspart  0-5 Units Subcutaneous QHS   insulin aspart  0-9 Units Subcutaneous TID WC   levothyroxine  188 mcg Oral  Q0600   lisinopril  10 mg Oral Daily   melatonin  6 mg Oral QHS   mirtazapine  15 mg Oral QHS   pantoprazole  40 mg Oral Daily   pravastatin  40 mg Oral q1800   Continuous Infusions:  PRN Meds: acetaminophen, ondansetron (ZOFRAN) IV  Allergies:    Allergies  Allergen Reactions   Fish Allergy Other (See Comments)    Wheezing.  All kinds of fish cause SOB and wheezing Specifically oysters and flounder. No problems with iodine topically   Garlic Other (See Comments)    wheezing   Other Other (See Comments)    Lettuce wheezing   Shellfish Allergy Other (See Comments)    wheezing   Celecoxib Other (See Comments)    Dyspepsia   Hydrocodone-Acetaminophen Hives   Meloxicam Nausea Only   Phentermine Other (See Comments)    Insomnia   Advil [Ibuprofen] Rash    Aleve, ibuprofen,motrin     Social History:   Social History   Socioeconomic History   Marital status: Married    Spouse name: Not on file   Number of children: Not on file   Years of education: Not on file   Highest education level: Not on file  Occupational History   Not on file  Tobacco Use   Smoking status: Never   Smokeless tobacco: Never  Vaping Use   Vaping status: Never Used  Substance and Sexual Activity    Alcohol use: No   Drug use: No   Sexual activity: Never  Other Topics Concern   Not on file  Social History Narrative   Not on file   Social Determinants of Health   Financial Resource Strain: Not on file  Food Insecurity: Not on file  Transportation Needs: Not on file  Physical Activity: Not on file  Stress: Not on file  Social Connections: Not on file  Intimate Partner Violence: Not on file    Family History:    Family History  Problem Relation Age of Onset   Breast cancer Neg Hx      ROS:  Please see the history of present illness.   All other ROS reviewed and negative.     Physical Exam/Data:   Vitals:   04/10/23 1616 04/10/23 1846 04/10/23 2207 04/11/23 0555  BP:  (!) 176/64 (!) 125/53 (!) 107/53  Pulse:  68 63 60  Resp:   16 16  Temp:  98.3 F (36.8 C) 98.7 F (37.1 C) 97.7 F (36.5 C)  TempSrc:  Oral  Oral  SpO2:  94% 98% 100%  Weight: 68 kg     Height: 5\' 2"  (1.575 m)       Intake/Output Summary (Last 24 hours) at 04/11/2023 0848 Last data filed at 04/11/2023 0500 Gross per 24 hour  Intake 120 ml  Output --  Net 120 ml      04/10/2023    4:16 PM 04/10/2023    2:57 PM 01/21/2022    6:27 AM  Last 3 Weights  Weight (lbs) 150 lb 143 lb 152 lb 6.4 oz  Weight (kg) 68.04 kg 64.864 kg 69.128 kg     Body mass index is 27.44 kg/m.  General: Pleasant elderly female appearing in no acute distress.  HEENT: normal Neck: no JVD Vascular: No carotid bruits; Distal pulses 2+ bilaterally Cardiac:  normal S1, S2; RRR; systolic murmur along sternal border.  Lungs:  clear to auscultation bilaterally, no wheezing, rhonchi or rales  Abd: soft, nontender, no hepatomegaly  Ext: no pitting edema Musculoskeletal:  No deformities, BUE and BLE strength normal and equal Skin: warm and dry  Neuro:  CNs 2-12 intact, no focal abnormalities noted Psych: Knows her name and that she is in the hospital. Does not know what town we are in or the year.   EKG:  The EKG was  personally reviewed and demonstrates: V-paced, HR 65 with IVCD and diffuse TWI along the inferior and precordial leads. QTc read as 525 ms but corrected at 458 ms.   Telemetry:  Telemetry was personally reviewed and demonstrates: V-paced, HR in 60's to 70's with occasional PVC's.   Relevant CV Studies:  Stress Echocardiogram: 04/27/16 INTERPRETATION  Normal Stress Echocardiogram  NORMAL RIGHT VENTRICULAR SYSTOLIC FUNCTION  MILD VALVULAR REGURGITATION (See above)  NO VALVULAR STENOSIS NOTED   Laboratory Data:  High Sensitivity Troponin:   Recent Labs  Lab 04/10/23 1514 04/10/23 1721  TROPONINIHS 5 7     Chemistry Recent Labs  Lab 04/10/23 1514 04/10/23 1721  NA 136  --   K 4.2  --   CL 102  --   CO2 24  --   GLUCOSE 113*  --   BUN 14  --   CREATININE 0.78  --   CALCIUM 8.3*  --   MG  --  2.0  GFRNONAA >60  --   ANIONGAP 10  --     No results for input(s): "PROT", "ALBUMIN", "AST", "ALT", "ALKPHOS", "BILITOT" in the last 168 hours. Lipids  Recent Labs  Lab 04/11/23 0454  CHOL 121  TRIG 77  HDL 53  LDLCALC 53  CHOLHDL 2.3    Hematology Recent Labs  Lab 04/10/23 1514  WBC 7.1  RBC 5.09  HGB 12.7  HCT 40.9  MCV 80.4  MCH 25.0*  MCHC 31.1  RDW 15.6*  PLT 295   Thyroid No results for input(s): "TSH", "FREET4" in the last 168 hours.  BNPNo results for input(s): "BNP", "PROBNP" in the last 168 hours.  DDimer No results for input(s): "DDIMER" in the last 168 hours.   Radiology/Studies:  DG Chest Port 1 View  Result Date: 04/10/2023 CLINICAL DATA:  Chest pain. EXAM: PORTABLE CHEST 1 VIEW COMPARISON:  September 30, 2018. FINDINGS: The heart size and mediastinal contours are within normal limits. Left-sided pacemaker is unchanged in position. Both lungs are clear. The visualized skeletal structures are unremarkable. IMPRESSION: No active disease. Electronically Signed   By: Lupita Raider M.D.   On: 04/10/2023 17:19     Assessment and Plan:   1. Chest Pain  with Atypical Features/Abnormal EKG - Her chest pain overall seems atypical for a cardiac etiology as it occurred after she had a choking episode while consuming food. Denies any recurrent pain overnight or this morning and feels back to baseline. Would recommend evaluation by Speech Therapy given risk for aspiration as outlined below. - Cardiac enzymes have been negative and her EKG does show diffuse TWI along the inferior and precordial leads.  QTc was read at 525 ms but once corrected for IVCD, this is at 458 ms. An echocardiogram is pending to assess for any structural abnormalities. If found to have significant abnormalities, would anticipate medical management at this time given her dementia and overall atypical symptoms.  2. SSS - She is s/p Medtronic PPM placement in 07/2017 which is followed by [facility] as an outpatient. Telemetry shows V-pacing with heart rate in the 60's to 70's.  3. HTN - BP was elevated while  in the ED at 176/64 but has improved, at 107/53 on most recent check. Continue current medical therapy with Lisinopril 10mg  daily.   4. HLD - FLP shows LDL is at 53. Continue current medical therapy with Pravastatin 40 mg daily (on formulary as she is on Lovastatin as an outpatient and would resume at discharge).   5. Risk for Aspiration - She reports frequently getting choked on foods. Would recommend Speech Therapy evaluation as she is at risk for aspiration.  For questions or updates, please contact [facility] Please consult [email or web address] for contact info under    Signed, Ellsworth Lennox, PA-C  04/11/2023 8:48 AM

## 2023-04-11 NOTE — Progress Notes (Signed)
Patient slept most of the night during this shift. Patient did attempt x2 to get out of bed but was redirected. No complaints of pain. Plan of care ongoing.

## 2023-04-11 NOTE — TOC Initial Note (Signed)
Transition of Care [facility]) - Initial/Assessment Note    Patient Details  Name: Caitlyn Stephenson MRN: [id number] Date of Birth: [birth year]  [facility] CM/SW Contact:    Karn Cassis, LCSW Phone Number: 04/11/2023, 10:26 AM  Clinical Narrative:  Pt admitted due to chest pain. Per son, pt has been resident at [facility] for about 2 years. Son requests return when medically stable. LCSW spoke with Jill Side at [facility]. Okay to return and no FL2 needed. Anticipate d/c tomorrow.                  Expected Discharge Plan: Skilled Nursing Facility Barriers to Discharge: Continued Medical Work up   Patient Goals and CMS Choice Patient states their goals for this hospitalization and ongoing recovery are:: return to SNF   Choice offered to / list presented to : Adult Children [place] ownership interest in [facility].provided to::  (n/a)    Expected Discharge Plan and Services In-house Referral: Clinical Social Work     Living arrangements for the past 2 months: Skilled Nursing Facility                                      Prior Living Arrangements/Services Living arrangements for the past 2 months: Skilled Nursing Facility Lives with:: Facility Resident Patient language and need for interpreter reviewed:: Yes Do you feel safe going back to the place where you live?: Yes        Care giver support system in place?: Yes (comment)   Criminal Activity/Legal Involvement Pertinent to Current Situation/Hospitalization: No - Comment as needed  Activities of Daily Living      Permission Sought/Granted                  Emotional Assessment         Alcohol / Substance Use: Not Applicable Psych Involvement: No (comment)  Admission diagnosis:  Chest pain [R07.9] Chest pain, unspecified type [R07.9] Patient Active Problem List   Diagnosis Date Noted   Chest pain 04/10/2023   Gastroesophageal reflux disease with hiatal hernia  01/21/2022   History of asthma 01/21/2022   Hyperlipidemia 01/21/2022   Hypertension 01/21/2022   Hypothyroidism 01/21/2022   Iron deficiency anemia 01/21/2022   Osteoarthritis 01/21/2022   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 ([address]),  - [address] Jerilynn M[place]) [place] [address] Phone: [phone] Fax: [phone]     Social Determinants of Health (SDOH) Social History: SDOH Screenings   Tobacco Use: Low Risk  (04/10/2023)   SDOH Interventions:     Readmission Risk Interventions     No data to display

## 2023-04-11 NOTE — Evaluation (Signed)
Clinical/Bedside Swallow Evaluation Patient Details  Name: Caitlyn Stephenson MRN: [id number] Date of Birth: Feb 06, 1941  Today's Date: 04/11/2023 Time: SLP Start Time (ACUTE ONLY): 1115 SLP Stop Time (ACUTE ONLY): 1135 SLP Time Calculation (min) (ACUTE ONLY): 20 min  Past Medical History:  Past Medical History:  Diagnosis Date   Anemia    Angioedema 01/2009   Arthritis    Asthma    Bradycardia 07/14/2017   a.) symptomatic; s/p Medtroinc dual chamber PPM placement   CKD (chronic kidney disease), stage III (HCC)    Complication of anesthesia    a.) delayed emergence   Edema    GERD (gastroesophageal reflux disease)    Hiatal hernia    High cholesterol    Hypertension    Hypothyroidism    a.) s/p thyroidectomy   Late onset Alzheimer dementia (HCC)    a.) on donepezil   LBBB (left bundle branch block) 05/11/2015   Lumbar stenosis with neurogenic claudication    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   a.) Medtronic dual chamber PPM (serial #: [id number])   SSS (sick sinus syndrome) (HCC) 07/14/2017   a.) s/p Medtroinc dual chamber PPM placement   T2DM (type 2 diabetes mellitus) (HCC)    Past Surgical History:  Past Surgical History:  Procedure Laterality Date   ABDOMINAL HYSTERECTOMY  1983   APPENDECTOMY     BREAST BIOPSY Right 1965   cyst removed   CHOLECYSTECTOMY     FOOT FUSION Right 03/2012   HYSTERECTOMY ABDOMINAL WITH SALPINGO-OOPHORECTOMY N/A    JOINT REPLACEMENT Right 03/12/2021   in [place]   KNEE ARTHROPLASTY Right 01/21/2022   Procedure: COMPUTER ASSISTED TOTAL KNEE ARTHROPLASTY;  Surgeon: Donato Heinz, MD;  Location: [facility] ORS;  Service: Orthopedics;  Laterality: Right;   PACEMAKER INSERTION Left 07/14/2017   Procedure: INSERTION PACEMAKER-DUAL CHAMBER;  Surgeon: Marcina Millard, MD;  Location: [facility] ORS;  Service: Cardiovascular;  Laterality: Left;   THYROIDECTOMY Bilateral 1970   TONSILLECTOMY Bilateral    UMBILICAL HERNIA REPAIR N/A  2007   Procedure: UMBILICAL HERNIA REPAIR; Location: [facility]; Surgeon: Renda Rolls, MD   HPI:  Caitlyn Stephenson is a 82 y.o. female with a hx of SSS (s/p Medtronic PPM placement in 07/2017), HTN, HLD, Type 2 DM, LBBB, Stage 3 CKD and dementia who is being seen 04/11/2023 for the evaluation of chest pain and abnormal EKG at the request of Dr. Randol Kern. Caitlyn Stephenson presented to [facility] ED on 04/10/2023 for evaluation of chest pain. Reported having choked on her food earlier in the morning when she was consuming pizza and took over 30 to 45 minutes to clear this away. BSE requested    Assessment / Plan / Recommendation  Clinical Impression  Clinical swallowing evaluation completed while Pt was sitting upright in bed. Pt reports an episode of getting "choked" on a piece of canned fruit (a pear) prior to admission. Pt's son at bedside reports she occasionally says she gets "choked" but it happens only every once in a while and not with consistent textures/consistencies. Nurse reports no difficulty with meds or reg/thin diet since admission. Pt consumed all consistencies and textures without overt s/sx of oropharyngeal dysphagia. SLP reviewed universal aspiration precautions and encouraged Pt to avoid problematic textures. Recommend continue with regular/thin diet and meds are ok whole with liquids. No further ST needs noted at this time, ST will sign off. Thank you for this referral, SLP Visit Diagnosis: Dysphagia, oropharyngeal phase (R13.12)    Aspiration Risk  Mild  aspiration risk    Diet Recommendation Regular;Thin liquid    Liquid Administration via: Cup;Straw Medication Administration: Whole meds with liquid Supervision: Patient able to self feed Compensations: Minimize environmental distractions;Slow rate;Small sips/bites Postural Changes: Seated upright at 90 degrees    Other  Recommendations Oral Care Recommendations: Oral care BID    Recommendations for follow up therapy are one  component of a multi-disciplinary discharge planning process, led by the attending physician.  Recommendations may be updated based on patient status, additional functional criteria and insurance authorization.  Follow up Recommendations No SLP follow up         Functional Status Assessment Patient has had a recent decline in their functional status and demonstrates the ability to make significant improvements in function in a reasonable and predictable amount of time.    Swallow Study   General Date of Onset: 04/10/23 HPI: Caitlyn Stephenson is a 82 y.o. female with a hx of SSS (s/p Medtronic PPM placement in 07/2017), HTN, HLD, Type 2 DM, LBBB, Stage 3 CKD and dementia who is being seen 04/11/2023 for the evaluation of chest pain and abnormal EKG at the request of Dr. Randol Kern. Caitlyn Stephenson presented to [facility] ED on 04/10/2023 for evaluation of chest pain. Reported having choked on her food earlier in the morning when she was consuming pizza and took over 30 to 45 minutes to clear this away. BSE requested Type of Study: Bedside Swallow Evaluation Previous Swallow Assessment: none in chart Diet Prior to this Study: Regular;Thin liquids (Level 0) Temperature Spikes Noted: No Respiratory Status: Room air History of Recent Intubation: No Behavior/Cognition: Alert;Cooperative;Pleasant mood Oral Cavity Assessment: Within Functional Limits Oral Care Completed by SLP: Recent completion by staff Oral Cavity - Dentition: Missing dentition;Poor condition Vision: Functional for self-feeding Self-Feeding Abilities: Able to feed self Patient Positioning: Upright in bed Baseline Vocal Quality: Normal Volitional Cough: Strong Volitional Swallow: Able to elicit    Oral/Motor/Sensory Function Overall Oral Motor/Sensory Function: Within functional limits   Ice Chips Ice chips: Within functional limits   Thin Liquid Thin Liquid: Within functional limits    Nectar Thick Nectar Thick Liquid: Not tested    Honey Thick Honey Thick Liquid: Not tested   Puree Puree: Within functional limits   Solid     Solid: Within functional limits     Anthany Thornhill H. Romie Levee, CCC-SLP Speech Language Pathologist  Georgetta Haber 04/11/2023,11:35 AM

## 2023-04-11 NOTE — Discharge Summary (Addendum)
Physician Discharge Summary  Caitlyn Stephenson XBM:[id number] DOB: [birth year] DOA: 04/10/2023  PCP: Lynnea Ferrier, MD Cardiology: Paraschos Admit date: 04/10/2023 Discharge date: 04/11/2023  Admitted From:  SNF Disposition:  SNF   Recommendations for Outpatient Follow-up:  Follow up with PCP in 1 weeks Follow up with cardiologist in 3 weeks  Discharge Condition: STABLE   CODE STATUS: DNR  DIET: regular, thin    Brief Hospitalization Summary: Please see all hospital notes, images, labs for full details of the hospitalization. Admission provider HPI:  82 y.o. female, with past medical history of sick sinus syndrome, left bundle branch block, pacemaker, hypertension, diabetes, GERD, CKD, Alzheimer's dementia, wheelchair dependent secondary to significant arthritis with knee replacement in the past, patient was sent from the facility to evaluate for chest pain, apparently patient stated choking her food this morning when she was eating pizza, did take her 30 to 45 minutes to clear it, she started to complain of being fatigued, has concern of chest pain endorsement by staff at the facility, given her dementia she cannot give any specific details about that, for during choking event. -Patient is being actively treated for bees, son reports tomorrow is her last day of isolation.. -In ED patient EKG did show some new T wave inversion, her troponins were negative, ED physician discussed with cardiology who recommended admission for cardiac workup, she received full dose aspirin, she is currently chest pain-free  Hospital Course  Patient was admitted for observation for atypical chest pain symptoms occurring after a choking spell that occurred at SNF long-term care facility.  Patient was eating pizza.  Patient was observed in the hospital and noted to have reassuring high-sensitivity troponin tests.  She was monitored on telemetry.  She was seen by the inpatient cardiology service.  They recommended  follow-up with her outpatient cardiologist in about 3 weeks and consider outpatient stress testing.  The patient is no longer having any chest pain symptoms.  She was evaluated by the speech therapist and regular food recommended with thin liquids.  Patient is feeling better with no complaints and is stable to discharge back to long-term care.  Again outpatient follow-up with primary care provider and with cardiologist recommended.  Discharge Diagnoses:  Principal Problem:   Chest pain Active Problems:   Sick sinus syndrome (HCC)   B12 deficiency   Bundle branch block, left   CKD (chronic kidney disease) stage 3, GFR 30-59 ml/min (HCC)   Controlled type 2 diabetes mellitus with complication, without long-term current use of insulin (HCC)   Gastroesophageal reflux disease with hiatal hernia   Hyperlipidemia   Hypertension   Hypothyroidism   Pacemaker  Allergies as of 04/11/2023       Reactions   Fish Allergy Other (See Comments)   Wheezing.  All kinds of fish cause SOB and wheezing Specifically oysters and flounder. No problems with iodine topically   Garlic Other (See Comments)   wheezing   Other Other (See Comments)   Lettuce wheezing   Shellfish Allergy Other (See Comments)   wheezing   Celecoxib Other (See Comments)   Dyspepsia   Hydrocodone-acetaminophen Hives   Meloxicam Nausea Only   Phentermine Other (See Comments)   Insomnia   Advil [ibuprofen] Rash   Aleve, ibuprofen,motrin         Medication List     STOP taking these medications    permethrin 5 % cream Commonly known as: ELIMITE       TAKE these medications  acetaminophen 500 MG tablet Commonly known as: TYLENOL Take 500-1,000 mg by mouth every 8 (eight) hours as needed for moderate pain.   busPIRone 7.5 MG tablet Commonly known as: BUSPAR Take 7.5 mg by mouth 3 (three) times daily.   calcium carbonate 500 MG chewable tablet Commonly known as: TUMS - dosed in mg elemental calcium Chew 1  tablet by mouth daily.   cetirizine 10 MG tablet Commonly known as: ZYRTEC Take 10 mg by mouth daily.   diclofenac sodium 1 % Gel Commonly known as: VOLTAREN Apply 2 g topically 4 (four) times daily.   escitalopram 10 MG tablet Commonly known as: LEXAPRO Take 10 mg by mouth daily.   Eucerin Daily Hydration Crea Apply 1 Application topically in the morning and at bedtime.   glimepiride 1 MG tablet Commonly known as: AMARYL Take 0.5 mg by mouth daily with breakfast.   guaiFENesin 100 MG/5ML liquid Commonly known as: ROBITUSSIN Take 10 mLs by mouth every 4 (four) hours as needed for cough or to loosen phlegm.   ivermectin 3 MG Tabs tablet Commonly known as: STROMECTOL Take 12 mg by mouth every 14 (fourteen) days. Give 4 tablets by mouth one time a day for scabies for 1 day x1 dose 03/28/23 and repeat in 2 weeks on 04/10/23   lansoprazole 15 MG capsule Commonly known as: PREVACID Take 15 mg by mouth daily at 12 noon.   levothyroxine 88 MCG tablet Commonly known as: SYNTHROID Take 88 mcg by mouth daily before breakfast.   levothyroxine 100 MCG tablet Commonly known as: SYNTHROID Take 100 mcg by mouth daily before breakfast.   lisinopril 5 MG tablet Commonly known as: ZESTRIL Take 10 mg by mouth daily.   loperamide 2 MG capsule Commonly known as: IMODIUM Take 2 mg by mouth every 6 (six) hours as needed for diarrhea or loose stools.   lovastatin 20 MG tablet Commonly known as: MEVACOR Take 40 mg by mouth at bedtime.   melatonin 5 MG Tabs Take 5 mg by mouth at bedtime.   mirtazapine 15 MG tablet Commonly known as: REMERON Take 15 mg by mouth at bedtime.   Vitamin B 12 500 MCG Tabs Take 500 mcg by mouth daily. What changed:  medication strength how much to take   VITAMIN D3 PO Take 1,000 Units by mouth daily.         Contact information for follow-up providers     Paraschos, Alexander, MD. Schedule an appointment as soon as possible for a visit in 3  week(s).   Specialty: Cardiology Contact information: [address] [facility] [place] [address] [phone]         Curtis Sites III, MD. Schedule an appointment as soon as possible for a visit in 1 week(s).   Specialty: Internal Medicine Why: Hospital Follow Up Contact information: [address] [facility] [place] [address] [phone]              Contact information for after-discharge care     Destination     [facility] .   Service: Skilled Nursing Contact information: [address] [place] [address] [phone]                    Allergies  Allergen Reactions   Fish Allergy Other (See Comments)    Wheezing.  All kinds of fish cause SOB and wheezing Specifically oysters and flounder. No problems with iodine topically  Garlic Other (See Comments)    wheezing   Other Other (See Comments)    Lettuce wheezing   Shellfish Allergy Other (See Comments)    wheezing   Celecoxib Other (See Comments)    Dyspepsia   Hydrocodone-Acetaminophen Hives   Meloxicam Nausea Only   Phentermine Other (See Comments)    Insomnia   Advil [Ibuprofen] Rash    Aleve, ibuprofen,motrin      Procedures/Studies: DG Chest Port 1 View  Result Date: 04/10/2023 CLINICAL DATA:  Chest pain. EXAM: PORTABLE CHEST 1 VIEW COMPARISON:  September 30, 2018. FINDINGS: The heart size and mediastinal contours are within normal limits. Left-sided pacemaker is unchanged in position. Both lungs are clear. The visualized skeletal structures are unremarkable. IMPRESSION: No active disease. Electronically Signed   By: Lupita Raider M.D.   On: 04/10/2023 17:19     Subjective: Pt reports no chest pain symptoms, no SOB.  No complaints. Pt tolerating diet well and worked with SLP and evaluated.   Discharge Exam: Vitals:   04/10/23 2207 04/11/23 0555  BP: (!) 125/53 (!) 107/53  Pulse: 63  60  Resp: 16 16  Temp: 98.7 F (37.1 C) 97.7 F (36.5 C)  SpO2: 98% 100%   Vitals:   04/10/23 1616 04/10/23 1846 04/10/23 2207 04/11/23 0555  BP:  (!) 176/64 (!) 125/53 (!) 107/53  Pulse:  68 63 60  Resp:   16 16  Temp:  98.3 F (36.8 C) 98.7 F (37.1 C) 97.7 F (36.5 C)  TempSrc:  Oral  Oral  SpO2:  94% 98% 100%  Weight: 68 kg     Height: 5\' 2"  (1.575 m)      General: Pt is alert, awake, not in acute distress Cardiovascular: RRR, S1/S2 +, no rubs, no gallops Respiratory: CTA bilaterally, no wheezing, no rhonchi Abdominal: Soft, NT, ND, bowel sounds + Extremities: no edema, no cyanosis   The results of significant diagnostics from this hospitalization (including imaging, microbiology, ancillary and laboratory) are listed below for reference.     Microbiology: No results found for this or any previous visit (from the past 240 hour(s)).   Labs: BNP (last 3 results) No results for input(s): "BNP" in the last 8760 hours. Basic Metabolic Panel: Recent Labs  Lab 04/10/23 1514 04/10/23 1721  NA 136  --   K 4.2  --   CL 102  --   CO2 24  --   GLUCOSE 113*  --   BUN 14  --   CREATININE 0.78  --   CALCIUM 8.3*  --   MG  --  2.0   Liver Function Tests: No results for input(s): "AST", "ALT", "ALKPHOS", "BILITOT", "PROT", "ALBUMIN" in the last 168 hours. No results for input(s): "LIPASE", "AMYLASE" in the last 168 hours. No results for input(s): "AMMONIA" in the last 168 hours. CBC: Recent Labs  Lab 04/10/23 1514  WBC 7.1  HGB 12.7  HCT 40.9  MCV 80.4  PLT 295   Cardiac Enzymes: No results for input(s): "CKTOTAL", "CKMB", "CKMBINDEX", "TROPONINI" in the last 168 hours. BNP: Invalid input(s): "POCBNP" CBG: Recent Labs  Lab 04/10/23 2150 04/11/23 0725 04/11/23 1128  GLUCAP 122* 77 171*   D-Dimer No results for input(s): "DDIMER" in the last 72 hours. Hgb A1c Recent Labs    04/10/23 1514  HGBA1C 6.3*   Lipid Profile Recent Labs    04/11/23 0454   CHOL 121  HDL 53  LDLCALC 53  TRIG 77  CHOLHDL 2.3  Thyroid function studies No results for input(s): "TSH", "T4TOTAL", "T3FREE", "THYROIDAB" in the last 72 hours.  Invalid input(s): "FREET3" Anemia work up No results for input(s): "VITAMINB12", "FOLATE", "FERRITIN", "TIBC", "IRON", "RETICCTPCT" in the last 72 hours. Urinalysis    Component Value Date/Time   COLORURINE YELLOW (A) 01/10/2022 1217   APPEARANCEUR CLEAR (A) 01/10/2022 1217   LABSPEC 1.012 01/10/2022 1217   PHURINE 5.0 01/10/2022 1217   GLUCOSEU NEGATIVE 01/10/2022 1217   HGBUR NEGATIVE 01/10/2022 1217   BILIRUBINUR NEGATIVE 01/10/2022 1217   KETONESUR NEGATIVE 01/10/2022 1217   PROTEINUR NEGATIVE 01/10/2022 1217   NITRITE NEGATIVE 01/10/2022 1217   LEUKOCYTESUR NEGATIVE 01/10/2022 1217   Sepsis Labs Recent Labs  Lab 04/10/23 1514  WBC 7.1   Microbiology No results found for this or any previous visit (from the past 240 hour(s)).  Time coordinating discharge:   SIGNED:  Standley Dakins, MD  [facility] 04/11/2023, 1:14 PM How to contact the [facility] Attending or Consulting provider 7A - 7P or covering provider during after hours 7P -7A, for this patient?  Check the care team in [facility] and look for a) attending/consulting [facility] provider listed and b) the [facility] team listed Log into [email or web address] and use [place]'s universal password to access. If you do not have the password, please contact the hospital operator. Locate the [facility] provider you are looking for under [facility] and page to a number that you can be directly reached. If you still have difficulty reaching the provider, please page the [facility] (Director on Call) for the Hospitalists listed on amion for assistance.

## 2023-04-11 NOTE — Progress Notes (Signed)
Report given to Ixchel LPN

## 2023-04-11 NOTE — Progress Notes (Signed)
Attempted Echocardiogram, patient was discharged.

## 2023-04-11 NOTE — TOC Transition Note (Signed)
Transition of Care [facility]) - CM/SW Discharge Note   Patient Details  Name: Caitlyn Stephenson MRN: [id number] Date of Birth: 1940/09/12  [facility]) CM/SW Contact:  Karn Cassis, LCSW Phone Number: 04/11/2023, 12:02 PM   Clinical Narrative: Pt d/c today back to [facility]. Son and facility aware and agreeable. D/C summary sent to SNF. Son requests [facility]. RN completed rider waiver. LCSW to arrange. RN given number to call report. No FL2 needed.       Final next level of care: Skilled Nursing Facility Barriers to Discharge: Barriers Resolved   Patient Goals and CMS Choice   Choice offered to / list presented to : Adult Children  Discharge Placement                  Patient to be transferred to facility by: [facility] Name of family member notified: [name]- son Patient and family notified of of transfer: 04/11/23  Discharge Plan and Services Additional resources added to the After Visit Summary for   In-house Referral: Clinical Social Work                                   Social Determinants of Health (SDOH) Interventions SDOH Screenings   Tobacco Use: Low Risk  (04/10/2023)     Readmission Risk Interventions     No data to display

## 2023-04-11 NOTE — Progress Notes (Signed)
Nsg Discharge Note  Admit Date:  04/10/2023 Discharge date: 04/11/2023   Caitlyn Stephenson to be D/C'd Home per MD order.  AVS completed.  Copy for chart, and copy for patient signed, and dated. Patient/caregiver able to verbalize understanding.  Discharge Medication: Allergies as of 04/11/2023       Reactions   Fish Allergy Other (See Comments)   Wheezing.  All kinds of fish cause SOB and wheezing Specifically oysters and flounder. No problems with iodine topically   Garlic Other (See Comments)   wheezing   Other Other (See Comments)   Lettuce wheezing   Shellfish Allergy Other (See Comments)   wheezing   Celecoxib Other (See Comments)   Dyspepsia   Hydrocodone-acetaminophen Hives   Meloxicam Nausea Only   Phentermine Other (See Comments)   Insomnia   Advil [ibuprofen] Rash   Aleve, ibuprofen,motrin         Medication List     STOP taking these medications    permethrin 5 % cream Commonly known as: ELIMITE       TAKE these medications    acetaminophen 500 MG tablet Commonly known as: TYLENOL Take 500-1,000 mg by mouth every 8 (eight) hours as needed for moderate pain.   busPIRone 7.5 MG tablet Commonly known as: BUSPAR Take 7.5 mg by mouth 3 (three) times daily.   calcium carbonate 500 MG chewable tablet Commonly known as: TUMS - dosed in mg elemental calcium Chew 1 tablet by mouth daily.   cetirizine 10 MG tablet Commonly known as: ZYRTEC Take 10 mg by mouth daily.   diclofenac sodium 1 % Gel Commonly known as: VOLTAREN Apply 2 g topically 4 (four) times daily.   escitalopram 10 MG tablet Commonly known as: LEXAPRO Take 10 mg by mouth daily.   Eucerin Daily Hydration Crea Apply 1 Application topically in the morning and at bedtime.   glimepiride 1 MG tablet Commonly known as: AMARYL Take 0.5 mg by mouth daily with breakfast.   guaiFENesin 100 MG/5ML liquid Commonly known as: ROBITUSSIN Take 10 mLs by mouth every 4 (four) hours as needed  for cough or to loosen phlegm.   ivermectin 3 MG Tabs tablet Commonly known as: STROMECTOL Take 12 mg by mouth every 14 (fourteen) days. Give 4 tablets by mouth one time a day for scabies for 1 day x1 dose 03/28/23 and repeat in 2 weeks on 04/10/23   lansoprazole 15 MG capsule Commonly known as: PREVACID Take 15 mg by mouth daily at 12 noon.   levothyroxine 88 MCG tablet Commonly known as: SYNTHROID Take 88 mcg by mouth daily before breakfast.   levothyroxine 100 MCG tablet Commonly known as: SYNTHROID Take 100 mcg by mouth daily before breakfast.   lisinopril 5 MG tablet Commonly known as: ZESTRIL Take 10 mg by mouth daily.   loperamide 2 MG capsule Commonly known as: IMODIUM Take 2 mg by mouth every 6 (six) hours as needed for diarrhea or loose stools.   lovastatin 20 MG tablet Commonly known as: MEVACOR Take 40 mg by mouth at bedtime.   melatonin 5 MG Tabs Take 5 mg by mouth at bedtime.   mirtazapine 15 MG tablet Commonly known as: REMERON Take 15 mg by mouth at bedtime.   Vitamin B 12 500 MCG Tabs Take 500 mcg by mouth daily. What changed:  medication strength how much to take   VITAMIN D3 PO Take 1,000 Units by mouth daily.        Discharge Assessment: Vitals:  04/10/23 2207 04/11/23 0555  BP: (!) 125/53 (!) 107/53  Pulse: 63 60  Resp: 16 16  Temp: 98.7 F (37.1 C) 97.7 F (36.5 C)  SpO2: 98% 100%   Skin clean, dry and intact without evidence of skin break down, no evidence of skin tears noted. IV catheter discontinued intact. Site without signs and symptoms of complications - no redness or edema noted at insertion site, patient denies c/o pain - only slight tenderness at site.  Dressing with slight pressure applied.  D/c Instructions-Education: Discharge instructions given to patient/family with verbalized understanding. D/c education completed with patient/family including follow up instructions, medication list, d/c activities limitations if  indicated, with other d/c instructions as indicated by MD - patient able to verbalize understanding, all questions fully answered. Patient instructed to return to ED, call 911, or call MD for any changes in condition.  Patient escorted via WC, and D/C home via private auto.  Demetrio Lapping, LPN  16/04/9603 2:07 PM

## 2023-05-21 ENCOUNTER — Other Ambulatory Visit (HOSPITAL_COMMUNITY): Payer: Medicare Other
